# Patient Record
Sex: Female | Born: 1965 | Race: Black or African American | Hispanic: No | Smoking: Current every day smoker
Health system: Southern US, Community
[De-identification: ages and names within clinical notes are randomized; demographics above are authoritative.]

## PROBLEM LIST (undated history)

## (undated) DIAGNOSIS — R569 Unspecified convulsions: Secondary | ICD-10-CM

## (undated) DIAGNOSIS — R519 Headache, unspecified: Secondary | ICD-10-CM

## (undated) DIAGNOSIS — R51 Headache: Secondary | ICD-10-CM

## (undated) DIAGNOSIS — J449 Chronic obstructive pulmonary disease, unspecified: Secondary | ICD-10-CM

## (undated) HISTORY — DX: Unspecified convulsions: R56.9

## (undated) HISTORY — DX: Headache: R51

## (undated) HISTORY — DX: Headache, unspecified: R51.9

## (undated) HISTORY — PX: LEEP: SHX91

## (undated) HISTORY — PX: TUBAL LIGATION: SHX77

## (undated) HISTORY — DX: Chronic obstructive pulmonary disease, unspecified: J44.9

---

## 2016-10-12 ENCOUNTER — Ambulatory Visit: Payer: Self-pay | Admitting: Family Medicine

## 2016-10-12 ENCOUNTER — Ambulatory Visit: Payer: Medicaid Other | Attending: Family Medicine | Admitting: Family Medicine

## 2016-10-12 ENCOUNTER — Encounter: Payer: Self-pay | Admitting: Family Medicine

## 2016-10-12 VITALS — BP 124/81 | HR 63 | Temp 98.1°F | Resp 20 | Ht 67.0 in | Wt 164.5 lb

## 2016-10-12 DIAGNOSIS — Z8709 Personal history of other diseases of the respiratory system: Secondary | ICD-10-CM | POA: Diagnosis not present

## 2016-10-12 DIAGNOSIS — R1011 Right upper quadrant pain: Secondary | ICD-10-CM | POA: Insufficient documentation

## 2016-10-12 DIAGNOSIS — R1031 Right lower quadrant pain: Secondary | ICD-10-CM | POA: Insufficient documentation

## 2016-10-12 DIAGNOSIS — J449 Chronic obstructive pulmonary disease, unspecified: Secondary | ICD-10-CM | POA: Diagnosis not present

## 2016-10-12 DIAGNOSIS — R569 Unspecified convulsions: Secondary | ICD-10-CM | POA: Insufficient documentation

## 2016-10-12 DIAGNOSIS — K5900 Constipation, unspecified: Secondary | ICD-10-CM | POA: Diagnosis not present

## 2016-10-12 DIAGNOSIS — Z79899 Other long term (current) drug therapy: Secondary | ICD-10-CM | POA: Insufficient documentation

## 2016-10-12 DIAGNOSIS — K6289 Other specified diseases of anus and rectum: Secondary | ICD-10-CM | POA: Insufficient documentation

## 2016-10-12 DIAGNOSIS — F17210 Nicotine dependence, cigarettes, uncomplicated: Secondary | ICD-10-CM | POA: Diagnosis not present

## 2016-10-12 DIAGNOSIS — Z87898 Personal history of other specified conditions: Secondary | ICD-10-CM | POA: Diagnosis not present

## 2016-10-12 LAB — CBC WITH DIFFERENTIAL/PLATELET
BASOS PCT: 1 %
Basophils Absolute: 73 cells/uL (ref 0–200)
EOS PCT: 2 %
Eosinophils Absolute: 146 cells/uL (ref 15–500)
HEMATOCRIT: 42 % (ref 35.0–45.0)
HEMOGLOBIN: 13.6 g/dL (ref 11.7–15.5)
LYMPHS ABS: 2701 {cells}/uL (ref 850–3900)
Lymphocytes Relative: 37 %
MCH: 26.3 pg — ABNORMAL LOW (ref 27.0–33.0)
MCHC: 32.4 g/dL (ref 32.0–36.0)
MCV: 81.2 fL (ref 80.0–100.0)
MPV: 9.7 fL (ref 7.5–12.5)
Monocytes Absolute: 438 cells/uL (ref 200–950)
Monocytes Relative: 6 %
NEUTROS PCT: 54 %
Neutro Abs: 3942 cells/uL (ref 1500–7800)
PLATELETS: 308 10*3/uL (ref 140–400)
RBC: 5.17 MIL/uL — AB (ref 3.80–5.10)
RDW: 14.5 % (ref 11.0–15.0)
WBC: 7.3 10*3/uL (ref 3.8–10.8)

## 2016-10-12 LAB — POCT URINALYSIS DIPSTICK
Bilirubin, UA: NEGATIVE
GLUCOSE UA: NEGATIVE
Ketones, UA: NEGATIVE
Leukocytes, UA: NEGATIVE
NITRITE UA: NEGATIVE
PROTEIN UA: NEGATIVE
RBC UA: NEGATIVE
Spec Grav, UA: 1.01
UROBILINOGEN UA: 0.2
pH, UA: 7.5

## 2016-10-12 MED ORDER — SENNOSIDES-DOCUSATE SODIUM 8.6-50 MG PO TABS: 2.0000 | ORAL_TABLET | Freq: Every day | ORAL | Status: DC

## 2016-10-12 MED ORDER — MOMETASONE FURO-FORMOTEROL FUM 100-5 MCG/ACT IN AERO: 2.0000 | INHALATION_SPRAY | Freq: Two times a day (BID) | RESPIRATORY_TRACT | 2 refills | Status: AC

## 2016-10-12 MED ORDER — ALBUTEROL SULFATE HFA 108 (90 BASE) MCG/ACT IN AERS: 2.0000 | INHALATION_SPRAY | Freq: Four times a day (QID) | RESPIRATORY_TRACT | 2 refills | Status: DC | PRN

## 2016-10-12 NOTE — Patient Instructions (Addendum)
You will be called with your lab results. Complete financial paperwork to apply for orange card to complete referral process. Follow up with referral once you receive orange card    Abdominal Pain, Adult Many things can cause belly (abdominal) pain. Most times, belly pain is not dangerous. Many cases of belly pain can be watched and treated at home. Sometimes belly pain is serious, though. Your doctor will try to find the cause of your belly pain. Follow these instructions at home:  Take over-the-counter and prescription medicines only as told by your doctor. Do not take medicines that help you poop (laxatives) unless told to by your doctor.  Drink enough fluid to keep your pee (urine) clear or pale yellow.  Watch your belly pain for any changes.  Keep all follow-up visits as told by your doctor. This is important. Contact a doctor if:  Your belly pain changes or gets worse.  You are not hungry, or you lose weight without trying.  You are having trouble pooping (constipated) or have watery poop (diarrhea) for more than 2-3 days.  You have pain when you pee or poop.  Your belly pain wakes you up at night.  Your pain gets worse with meals, after eating, or with certain foods.  You are throwing up and cannot keep anything down.  You have a fever. Get help right away if:  Your pain does not go away as soon as your doctor says it should.  You cannot stop throwing up.  Your pain is only in areas of your belly, such as the right side or the left lower part of the belly.  You have bloody or black poop, or poop that looks like tar.  You have very bad pain, cramping, or bloating in your belly.  You have signs of not having enough fluid or water in your body (dehydration), such as:  Dark pee, very little pee, or no pee.  Cracked lips.  Dry mouth.  Sunken eyes.  Sleepiness.  Weakness. This information is not intended to replace advice given to you by your health care  provider. Make sure you discuss any questions you have with your health care provider. Document Released: 01/05/2008 Document Revised: 02/06/2016 Document Reviewed: 12/31/2015 Elsevier Interactive Patient Education  2017 [employer] Inc[employer]   Constipation, Adult Constipation is when a person:  Poops (has a bowel movement) fewer times in a week than normal.  Has a hard time pooping.  Has poop that is dry, hard, or bigger than normal. Follow these instructions at home: Eating and drinking    Eat foods that have a lot of fiber, such as:  Fresh fruits and vegetables.  Whole grains.  Beans.  Eat less of foods that are high in fat, low in fiber, or overly processed, such as:  [place] fries.  Hamburgers.  Cookies.  Candy.  Soda.  Drink enough fluid to keep your pee (urine) clear or pale yellow. General instructions   Exercise regularly or as told by your doctor.  Go to the restroom when you feel like you need to poop. Do not hold it in.  Take over-the-counter and prescription medicines only as told by your doctor. These include any fiber supplements.  Do pelvic floor retraining exercises, such as:  Doing deep breathing while relaxing your lower belly (abdomen).  Relaxing your pelvic floor while pooping.  Watch your condition for any changes.  Keep all follow-up visits as told by your doctor. This is important. Contact a doctor if:  You have pain that gets worse.  You have a fever.  You have not pooped for 4 days.  You throw up (vomit).  You are not hungry.  You lose weight.  You are bleeding from the anus.  You have thin, pencil-like poop (stool). Get help right away if:  You have a fever, and your symptoms suddenly get worse.  You leak poop or have blood in your poop.  Your belly feels hard or bigger than normal (is bloated).  You have very bad belly pain.  You feel dizzy or you faint. This information is not intended to replace advice given to  you by your health care provider. Make sure you discuss any questions you have with your health care provider. Document Released: 01/05/2008 Document Revised: 02/06/2016 Document Reviewed: 01/07/2016 Elsevier Interactive Patient Education  2017 Elsevier Inc.  Abdominal or Pelvic Ultrasound An ultrasound is a test that takes pictures of the inside of the body. An abdominal ultrasound takes pictures of your belly. A pelvic ultrasound takes pictures of the area between your belly and thighs. An ultrasound may be done to check an organ or look for problems. If you are pregnant, it may be done to learn about your baby. What happens before the procedure?  Follow instructions from your doctor about what you cannot eat or drink.  Wear clothing that is easy to wash. Gel from the test might get on your clothes. What happens during the procedure?  A gel will be put on your skin. It may feel cool.  A wand called a transducer will be put on your skin.  The wand will take pictures. They will show on small TV screens. What happens after the procedure?  Get your test results. Ask your doctor or the department that did the test when your results will be ready.  Keep follow-up visits as told by your doctor. This is important. This information is not intended to replace advice given to you by your health care provider. Make sure you discuss any questions you have with your health care provider. Document Released: 08/21/2010 Document Revised: 03/18/2016 Document Reviewed: 04/11/2015 Elsevier Interactive Patient Education  2017 [employer]

## 2016-10-12 NOTE — Progress Notes (Signed)
Subjective:  Patient ID: Mary Mclaughlin, female    DOB: 01/17/1966  Age: 51 y.o. MRN: [id number]  CC: No chief complaint on file.   HPI Mary Mclaughlin presents for  To Establish care: Reports moving from a different state several months ago. She doesn't bring her records with her.  COPD:  Reports being last seen by her pulmonologist in June 2017. Current everyday smoker for last 28 years. Reports smoking less than 10 cigarettes per day. Reports being without her Dulera for several months.   Rectal pain: Stabbing, intermittment  rectal pain 2 weeks ago, RLQ/RUQ pain at the umbilicus. Nausea and vomiting. Vomited on Saturday 4 times with clear thick emesis. History of hemorrhoids. Difficulty passing bowel movements.  Once a week bowel movements for 2 weeks now. Dark green stool. Unsure of family history of colon cancer. Reports history of appendicitis. Reports having done in colonoscopy 2015 that was normal.  Denies taking anything for symptoms.   Psychiatrist - Reports history of seeing a psychiatrist out of state for depressoin. Reports currently seeing psychiatrist at [facility] with upcoming appointment on April 3 rd . Denies any SI/HI.  Seizure: Last seizure Feb. 17. Reports taking Topamax since 2012. Reports seizures  well controlled with Topamax.    No outpatient prescriptions prior to visit.   No facility-administered medications prior to visit.     ROS Review of Systems  Constitutional: Negative.   Respiratory: Negative.   Cardiovascular: Negative.   Gastrointestinal: Positive for abdominal pain, constipation, nausea, rectal pain and vomiting. Negative for blood in stool.  Neurological: Negative.   Psychiatric/Behavioral: Negative for suicidal ideas.    Objective:  BP 124/81 (BP Location: Left Arm, Patient Position: Sitting, Cuff Size: Small)   Pulse 63   Temp 98.1 F (36.7 C) (Oral)   Resp 20   Ht 5\' 7"  (1.702 m)   Wt 164 lb 8 oz (74.6 kg)   LMP 10/02/2016 (Exact Date)    SpO2 100%   BMI 25.76 kg/m   BP/Weight 10/12/2016  Systolic BP 124  Diastolic BP 81  Wt. (Lbs) 164.5  BMI 25.76    Physical Exam  Constitutional: She is oriented to person, place, and time.  HENT:  Head: Normocephalic.  Right Ear: External ear normal.  Left Ear: External ear normal.  Nose: Nose normal.  Mouth/Throat: Oropharynx is clear and moist.  Eyes: Conjunctivae are normal. Pupils are equal, round, and reactive to light.  Neck: No JVD present.  Cardiovascular: Normal rate, regular rhythm, normal heart sounds and intact distal pulses.   Pulmonary/Chest: Effort normal and breath sounds normal.  Abdominal: Soft. Bowel sounds are normal. There is tenderness (RUQ/RLQ).  Genitourinary: Rectal exam shows no mass and guaiac negative stool.  Neurological: She is alert and oriented to person, place, and time.  Skin: Skin is warm and dry.  Psychiatric: She expresses no homicidal and no suicidal ideation. She expresses no suicidal plans and no homicidal plans.  Nursing note and vitals reviewed.   Assessment & Plan:   Problem List Items Addressed This Visit    None    Visit Diagnoses    RLQ abdominal pain    -  Primary   Relevant Orders   COMPLETE METABOLIC PANEL WITH GFR (Completed)   Lipid Panel (Completed)   CBC with Differential (Completed)   US Abdomen Complete   Urinalysis Dipstick (Completed)   Ambulatory referral to Gastroenterology   POC Hemoccult Bld/Stl (1-Cd Office Dx)   RUQ abdominal pain  Relevant Orders   COMPLETE METABOLIC PANEL WITH GFR (Completed)   Lipid Panel (Completed)   CBC with Differential (Completed)   US Abdomen Complete   Ambulatory referral to Gastroenterology   History of COPD       Relevant Medications   mometasone-formoterol (DULERA) 100-5 MCG/ACT AERO   albuterol (PROVENTIL HFA;VENTOLIN HFA) 108 (90 Base) MCG/ACT inhaler   Other Relevant Orders   Ambulatory referral to Pulmonology   History of seizure       Rectal pain        -Fecal hemoccult negative in office.    Relevant Medications   senna-docusate (SENNA-S) 8.6-50 MG tablet   Other Relevant Orders   Ambulatory referral to Gastroenterology   POC Hemoccult Bld/Stl (1-Cd Office Dx)   Difficult bowel movements       Relevant Medications   senna-docusate (SENNA-S) 8.6-50 MG tablet   Other Relevant Orders   Ambulatory referral to Gastroenterology   POC Hemoccult Bld/Stl (1-Cd Office Dx)   Constipation, unspecified constipation type       Relevant Medications   senna-docusate (SENNA-S) 8.6-50 MG tablet   Other Relevant Orders   Ambulatory referral to Gastroenterology   POC Hemoccult Bld/Stl (1-Cd Office Dx)      Meds ordered this encounter  Medications  . senna-docusate (SENNA-S) 8.6-50 MG tablet    Sig: Take 2 tablets by mouth at bedtime.    Order Specific Question:   Supervising Provider    Answer:   Quentin Angst [id number]  . mometasone-formoterol (DULERA) 100-5 MCG/ACT AERO    Sig: Inhale 2 puffs into the lungs 2 (two) times daily.    Dispense:  13 g    Refill:  2    Order Specific Question:   Supervising Provider    Answer:   Quentin Angst [id number]  . albuterol (PROVENTIL HFA;VENTOLIN HFA) 108 (90 Base) MCG/ACT inhaler    Sig: Inhale 2 puffs into the lungs every 6 (six) hours as needed for wheezing or shortness of breath.    Dispense:  1 Inhaler    Refill:  2    Order Specific Question:   Supervising Provider    Answer:   Quentin Angst [id number]    Follow-up: Return if symptoms worsen or fail to improve. Return in about 1 month (around 11/12/2016),  for COPD.   Lizbeth Bark FNP

## 2016-10-13 ENCOUNTER — Telehealth: Payer: Self-pay

## 2016-10-13 LAB — COMPLETE METABOLIC PANEL WITH GFR
ALBUMIN: 4.6 g/dL (ref 3.6–5.1)
ALT: 12 U/L (ref 6–29)
AST: 21 U/L (ref 10–35)
Alkaline Phosphatase: 68 U/L (ref 33–115)
BILIRUBIN TOTAL: 0.8 mg/dL (ref 0.2–1.2)
BUN: 11 mg/dL (ref 7–25)
CO2: 22 mmol/L (ref 20–31)
CREATININE: 0.95 mg/dL (ref 0.50–1.10)
Calcium: 10.1 mg/dL (ref 8.6–10.2)
Chloride: 102 mmol/L (ref 98–110)
GFR, Est African American: 82 mL/min (ref >=60)
GFR, Est Non African American: 71 mL/min (ref >=60)
GLUCOSE: 84 mg/dL (ref 65–99)
Potassium: 4.2 mmol/L (ref 3.5–5.3)
SODIUM: 139 mmol/L (ref 135–146)
TOTAL PROTEIN: 7.8 g/dL (ref 6.1–8.1)

## 2016-10-13 LAB — LIPID PANEL
Cholesterol: 184 mg/dL (ref <200)
HDL: 68 mg/dL (ref >50)
LDL CALC: 102 mg/dL — AB (ref <100)
Total CHOL/HDL Ratio: 2.7 Ratio (ref <5.0)
Triglycerides: 68 mg/dL (ref <150)
VLDL: 14 mg/dL (ref <30)

## 2016-10-13 NOTE — Telephone Encounter (Signed)
CMA call to go over lab results  Patient Verify DOB  Patient was aware and understood   

## 2016-10-13 NOTE — Telephone Encounter (Signed)
-----   Message from Lizbeth BarkMandesia R Hairston, [place] sent at 10/13/2016 12:44 PM EDT ----- Kidney function normal Liver function normal Cholesterol levels are normal Encourage patient to quit smoking.

## 2016-10-19 ENCOUNTER — Ambulatory Visit (HOSPITAL_COMMUNITY): Payer: Medicaid Other

## 2016-10-21 ENCOUNTER — Ambulatory Visit (HOSPITAL_COMMUNITY)
Admission: RE | Admit: 2016-10-21 | Discharge: 2016-10-21 | Disposition: A | Discharge status: Home / Self Care | Payer: Medicaid Other | Source: Ambulatory Visit | Attending: Family Medicine | Admitting: Family Medicine

## 2016-10-21 DIAGNOSIS — R1031 Right lower quadrant pain: Secondary | ICD-10-CM | POA: Insufficient documentation

## 2016-10-21 DIAGNOSIS — R1011 Right upper quadrant pain: Secondary | ICD-10-CM | POA: Diagnosis present

## 2016-10-22 ENCOUNTER — Telehealth: Payer: Self-pay

## 2016-10-22 NOTE — Telephone Encounter (Signed)
CMA call to go over lab results  Patient Verify DOB  Patient was aware and understood   

## 2016-10-22 NOTE — Telephone Encounter (Signed)
-----   Message from Lizbeth BarkMandesia R Hairston, FNP sent at 10/21/2016  5:55 PM EDT ----- Abdominal ultrasound was normal. No mass or structural abnormality of the organs were present. Follow up with gastroenterologist referral.

## 2016-11-17 ENCOUNTER — Ambulatory Visit: Payer: Medicaid Other | Admitting: Family Medicine

## 2016-11-24 ENCOUNTER — Encounter: Payer: Self-pay | Admitting: Family Medicine

## 2016-11-24 ENCOUNTER — Ambulatory Visit: Payer: Medicaid Other | Attending: Family Medicine | Admitting: Family Medicine

## 2016-11-24 VITALS — BP 114/76 | HR 68 | Temp 98.1°F | Resp 18 | Ht 67.0 in | Wt 164.0 lb

## 2016-11-24 DIAGNOSIS — K5900 Constipation, unspecified: Secondary | ICD-10-CM | POA: Diagnosis not present

## 2016-11-24 DIAGNOSIS — Z79899 Other long term (current) drug therapy: Secondary | ICD-10-CM | POA: Diagnosis not present

## 2016-11-24 DIAGNOSIS — F17210 Nicotine dependence, cigarettes, uncomplicated: Secondary | ICD-10-CM | POA: Diagnosis not present

## 2016-11-24 DIAGNOSIS — F17200 Nicotine dependence, unspecified, uncomplicated: Secondary | ICD-10-CM | POA: Diagnosis not present

## 2016-11-24 DIAGNOSIS — G40909 Epilepsy, unspecified, not intractable, without status epilepticus: Secondary | ICD-10-CM | POA: Diagnosis not present

## 2016-11-24 DIAGNOSIS — F329 Major depressive disorder, single episode, unspecified: Secondary | ICD-10-CM | POA: Insufficient documentation

## 2016-11-24 DIAGNOSIS — Z973 Presence of spectacles and contact lenses: Secondary | ICD-10-CM

## 2016-11-24 DIAGNOSIS — J449 Chronic obstructive pulmonary disease, unspecified: Secondary | ICD-10-CM | POA: Diagnosis not present

## 2016-11-24 DIAGNOSIS — Z8669 Personal history of other diseases of the nervous system and sense organs: Secondary | ICD-10-CM

## 2016-11-24 DIAGNOSIS — Z7951 Long term (current) use of inhaled steroids: Secondary | ICD-10-CM | POA: Insufficient documentation

## 2016-11-24 MED ORDER — PSYLLIUM 28 % PO PACK: 1.0000 | PACK | Freq: Two times a day (BID) | ORAL | Status: DC

## 2016-11-24 MED ORDER — NICOTINE 21 MG/24HR TD PT24: 21.0000 mg | MEDICATED_PATCH | Freq: Every day | TRANSDERMAL | 1 refills | Status: DC

## 2016-11-24 MED ORDER — BUTALBITAL-APAP-CAFFEINE 50-325-40 MG PO TABS: 1.0000 | ORAL_TABLET | Freq: Four times a day (QID) | ORAL | 0 refills | Status: DC | PRN

## 2016-11-24 MED ORDER — TOPIRAMATE 100 MG PO TABS: 100.0000 mg | ORAL_TABLET | Freq: Two times a day (BID) | ORAL | 0 refills | Status: DC

## 2016-11-24 NOTE — Patient Instructions (Signed)
Nicotine skin patches What is this medicine? NICOTINE (NIK [place]) helps people stop smoking. The patches replace the nicotine found in cigarettes and help to decrease withdrawal effects. They are most effective when used in combination with a stop-smoking program. This medicine may be used for other purposes; ask your health care provider or pharmacist if you have questions. COMMON BRAND NAME(S): Habitrol, Nicoderm CQ, Nicotrol What should I tell my health care provider before I take this medicine? They need to know if you have any of these conditions: -diabetes -heart disease, angina, irregular heartbeat or previous heart attack -high blood pressure -lung disease, including asthma -overactive thyroid -pheochromocytoma -seizures or a history of seizures -skin problems, like eczema -stomach problems or ulcers -an unusual or allergic reaction to nicotine, adhesives, other medicines, foods, dyes, or preservatives -pregnant or trying to get pregnant -breast-feeding How should I use this medicine? This medicine is for use on the skin. Follow the directions that come with the patches. Find an area of skin on your upper arm, chest, or back that is clean, dry, greaseless, undamaged and hairless. Wash hands with plain soap and water. Do not use anything that contains aloe, lanolin or glycerin as these may prevent the patch from sticking. Dry thoroughly. Remove the patch from the sealed pouch. Do not try to cut or trim the patch. Using your palm, press the patch firmly in place for 10 seconds to make sure that there is good contact with your skin. After applying the patch, wash your hands. Change the patch every day, keeping to a regular schedule. When you apply a new patch, use a new area of skin. Wait at least 1 week before using the same area again. Talk to your pediatrician regarding the use of this medicine in children. Special care may be needed. Overdosage: If you think you have taken too much  of this medicine contact a poison control center or emergency room at once. NOTE: This medicine is only for you. Do not share this medicine with others. What if I miss a dose? If you forget to replace a patch, use it as soon as you can. Only use one patch at a time and do not leave on the skin for longer than directed. If a patch falls off, you can replace it, but keep to your schedule and remove the patch at the right time. What may interact with this medicine? -medicines for asthma -medicines for blood pressure -medicines for mental depression This list may not describe all possible interactions. Give your health care provider a list of all the medicines, herbs, non-prescription drugs, or dietary supplements you use. Also tell them if you smoke, drink alcohol, or use illegal drugs. Some items may interact with your medicine. What should I watch for while using this medicine? You should begin using the nicotine patch the day you stop smoking. It is okay if you do not succeed at your attempt to quit and have a cigarette. You can still continue your quit attempt and keep using the product as directed. Just throw away your cigarettes and get back to your quit plan. You can keep the patch in place during swimming, bathing, and showering. If your patch falls off during these activities, replace it. When you first apply the patch, your skin may itch or burn. This should go away soon. When you remove a patch, the skin may look red, but this should only last for a few days. Call your doctor or health care professional   if skin redness does not go away after 4 days, if your skin swells, or if you get a rash. If you are a diabetic and you quit smoking, the effects of insulin may be increased and you may need to reduce your insulin dose. Check with your doctor or health care professional about how you should adjust your insulin dose. If you are going to have a magnetic resonance imaging (MRI) procedure, tell your  MRI technician if you have this patch on your body. It must be removed before a MRI. What side effects may I notice from receiving this medicine? Side effects that you should report to your doctor or health care professional as soon as possible: -allergic reactions like skin rash, itching or hives, swelling of the face, lips, or tongue -breathing problems -changes in hearing -changes in vision -chest pain -cold sweats -confusion -fast, irregular heartbeat -feeling faint or lightheaded, falls -headache -increased saliva -skin redness that lasts more than 4 days -stomach pain -signs and symptoms of nicotine overdose like nausea; vomiting; dizziness; weakness; and rapid heartbeat Side effects that usually do not require medical attention (report to your doctor or health care professional if they continue or are bothersome): -diarrhea -dry mouth -hiccups -irritability -nervousness or restlessness -trouble sleeping or vivid dreams This list may not describe all possible side effects. Call your doctor for medical advice about side effects. You may report side effects to FDA at 1-800-FDA-1088. Where should I keep my medicine? Keep out of the reach of children. Store at room temperature between 20 and 25 degrees C (68 and 77 degrees F). Protect from heat and light. Store in Tax inspector until ready to use. Throw away unused medicine after the expiration date. When you remove a patch, fold with sticky sides together; put in an empty opened pouch and throw away. NOTE: This sheet is a summary. It may not cover all possible information. If you have questions about this medicine, talk to your doctor, pharmacist, or health care provider.  2018 Elsevier/Gold Standard (2014-06-17 15:46:21) Acetaminophen; Butalbital; Caffeine tablets or capsules What is this medicine? ACETAMINOPHEN; BUTALBITAL; CAFFEINE (a set a MEE noe fen; byoo TAL bi tal; KAF een) is a pain reliever. It is used to treat  tension headaches. This medicine may be used for other purposes; ask your health care provider or pharmacist if you have questions. COMMON BRAND NAME(S): Alagesic, Americet, Anolor-300, Arcet, BAC, CAPACET, Dolgic Plus, Esgic, Esgic Plus, Ezol, Fioricet, Ryder System, Medigesic, [place], [address] [place], Phrenilin Forte, Repan, [place], Triad, Zebutal What should I tell my health care provider before I take this medicine? They need to know if you have any of these conditions: -drug abuse or addiction -heart or circulation problems -if you often drink alcohol -kidney disease or problems going to the bathroom -liver disease -lung disease, asthma, or breathing problems -porphyria -an unusual or allergic reaction to acetaminophen, butalbital or other barbiturates, caffeine, other medicines, foods, dyes, or preservatives -pregnant or trying to get pregnant -breast-feeding How should I use this medicine? Take this medicine by mouth with a full glass of water. Follow the directions on the prescription label. If the medicine upsets your stomach, take the medicine with food or milk. Do not take more than you are told to take. Talk to your pediatrician regarding the use of this medicine in children. Special care may be needed. Overdosage: If you think you have taken too much of this medicine contact a poison control center or emergency room at once. NOTE: This medicine  is only for you. Do not share this medicine with others. What if I miss a dose? If you miss a dose, take it as soon as you can. If it is almost time for your next dose, take only that dose. Do not take double or extra doses. What may interact with this medicine? -alcohol or medicines that contain alcohol -antidepressants, especially MAOIs like isocarboxazid, phenelzine, tranylcypromine, and selegiline -antihistamines -benzodiazepines -carbamazepine -isoniazid -medicines for pain like pentazocine, buprenorphine, butorphanol, nalbuphine,  tramadol, and propoxyphene -muscle relaxants -naltrexone -phenobarbital, phenytoin, and fosphenytoin -phenothiazines like perphenazine, thioridazine, chlorpromazine, mesoridazine, fluphenazine, prochlorperazine, promazine, and trifluoperazine -voriconazole This list may not describe all possible interactions. Give your health care provider a list of all the medicines, herbs, non-prescription drugs, or dietary supplements you use. Also tell them if you smoke, drink alcohol, or use illegal drugs. Some items may interact with your medicine. What should I watch for while using this medicine? Tell your doctor or health care professional if your pain does not go away, if it gets worse, or if you have new or a different type of pain. You may develop tolerance to the medicine. Tolerance means that you will need a higher dose of the medicine for pain relief. Tolerance is normal and is expected if you take the medicine for a long time. Do not suddenly stop taking your medicine because you may develop a severe reaction. Your body becomes used to the medicine. This does NOT mean you are addicted. Addiction is a behavior related to getting and using a drug for a non-medical reason. If you have pain, you have a medical reason to take pain medicine. Your doctor will tell you how much medicine to take. If your doctor wants you to stop the medicine, the dose will be slowly lowered over time to avoid any side effects. You may get drowsy or dizzy when you first start taking the medicine or change doses. Do not drive, use machinery, or do anything that may be dangerous until you know how the medicine affects you. Stand or sit up slowly. Do not take other medicines that contain acetaminophen with this medicine. Always read labels carefully. If you have questions, ask your doctor or pharmacist. If you take too much acetaminophen get medical help right away. Too much acetaminophen can be very dangerous and cause liver damage.  Even if you do not have symptoms, it is important to get help right away. What side effects may I notice from receiving this medicine? Side effects that you should report to your doctor or health care professional as soon as possible: -allergic reactions like skin rash, itching or hives, swelling of the face, lips, or tongue -breathing problems -confusion -feeling faint or lightheaded, falls -redness, blistering, peeling or loosening of the skin, including inside the mouth -seizure -stomach pain -yellowing of the eyes or skin Side effects that usually do not require medical attention (report to your doctor or health care professional if they continue or are bothersome): -constipation -nausea, vomiting This list may not describe all possible side effects. Call your doctor for medical advice about side effects. You may report side effects to FDA at 1-800-FDA-1088. Where should I keep my medicine? Keep out of the reach of children. This medicine can be abused. Keep your medicine in a safe place to protect it from theft. Do not share this medicine with anyone. Selling or giving away this medicine is dangerous and against the law. This medicine may cause accidental overdose and death if it  taken by other adults, children, or pets. Mix any unused medicine with a substance like cat litter or coffee grounds. Then throw the medicine away in a sealed container like a sealed bag or a coffee can with a lid. Do not use the medicine after the expiration date. Store at room temperature between 15 and 30 degrees C (59 and 86 degrees F). NOTE: This sheet is a summary. It may not cover all possible information. If you have questions about this medicine, talk to your doctor, pharmacist, or health care provider.  2018 Elsevier/Gold Standard (2013-09-14 15:00:25)  High-Fiber Diet Fiber, also called dietary fiber, is a type of carbohydrate found in fruits, vegetables, whole grains, and beans. A high-fiber diet can  have many health benefits. Your health care provider may recommend a high-fiber diet to help:  Prevent constipation. Fiber can make your bowel movements more regular.  Lower your cholesterol.  Relieve hemorrhoids, uncomplicated diverticulosis, or irritable bowel syndrome.  Prevent overeating as part of a weight-loss plan.  Prevent heart disease, type 2 diabetes, and certain cancers. What is my plan? The recommended daily intake of fiber includes:  38 grams for men under age 84.  30 grams for men over age 10.  25 grams for women under age 80.  21 grams for women over age 37. You can get the recommended daily intake of dietary fiber by eating a variety of fruits, vegetables, grains, and beans. Your health care provider may also recommend a fiber supplement if it is not possible to get enough fiber through your diet. What do I need to know about a high-fiber diet?  Fiber supplements have not been widely studied for their effectiveness, so it is better to get fiber through food sources.  Always check the fiber content on thenutrition facts label of any prepackaged food. Look for foods that contain at least 5 grams of fiber per serving.  Ask your dietitian if you have questions about specific foods that are related to your condition, especially if those foods are not listed in the following section.  Increase your daily fiber consumption gradually. Increasing your intake of dietary fiber too quickly may cause bloating, cramping, or gas.  Drink plenty of water. Water helps you to digest fiber. What foods can I eat? Grains  Whole-grain breads. Multigrain cereal. Oats and oatmeal. Brown rice. Barley. Bulgur wheat. Millet. Bran muffins. Popcorn. Rye wafer crackers. Vegetables  Sweet potatoes. Spinach. Kale. Artichokes. Cabbage. Broccoli. Green peas. Carrots. Squash. Fruits  Berries. Pears. Apples. Oranges. Avocados. Prunes and raisins. Dried figs. Meats and Other Protein Sources    [place], kidney, pinto, and soy beans. Split peas. Lentils. Nuts and seeds. Dairy  Fiber-fortified yogurt. Beverages  Fiber-fortified soy milk. Fiber-fortified orange juice. Other  Fiber bars. The items listed above may not be a complete list of recommended foods or beverages. Contact your dietitian for more options.  What foods are not recommended? Grains  White bread. Pasta made with refined flour. White rice. Vegetables  Fried potatoes. Canned vegetables. Well-cooked vegetables. Fruits  Fruit juice. Cooked, strained fruit. Meats and Other Protein Sources  Fatty cuts of meat. Fried Environmental education officer or fried fish. Dairy  Milk. Yogurt. Cream cheese. Sour cream. Beverages  Soft drinks. Other  Cakes and pastries. Butter and oils. The items listed above may not be a complete list of foods and beverages to avoid. Contact your dietitian for more information.  What are some tips for including high-fiber foods in my diet?  Eat a wide variety  of high-fiber foods.  Make sure that half of all grains consumed each day are whole grains.  Replace breads and cereals made from refined flour or white flour with whole-grain breads and cereals.  Replace white rice with brown rice, bulgur wheat, or millet.  Start the day with a breakfast that is high in fiber, such as a cereal that contains at least 5 grams of fiber per serving.  Use beans in place of meat in soups, salads, or pasta.  Eat high-fiber snacks, such as berries, raw vegetables, nuts, or popcorn. This information is not intended to replace advice given to you by your health care provider. Make sure you discuss any questions you have with your health care provider. Document Released: 07/19/2005 Document Revised: 12/25/2015 Document Reviewed: 01/01/2014 Elsevier Interactive Patient Education  2017 [employer].

## 2016-11-24 NOTE — Progress Notes (Signed)
Patient is here for f/up COPD  Patient complains dizziness as long with her migraines   Headaches are usually on her front & left side  Patient is not taking any current medication  Patient has eaten for today

## 2016-11-24 NOTE — Progress Notes (Signed)
Subjective:  Patient ID: Mary Mclaughlin, female    DOB: 03-22-1966  Age: 51 y.o. MRN: [id number]  CC: Establish Care   HPI Mary Mclaughlin presents for   COPD:  Reports that appointment May 17 with pulmonologist. Current smoker half a pack a day for over 20 years. Denies any shortness of breath or chronic cough or hemoptysis. Reports adherence with Dulera. Denies any increased inhaler use. She reports she is ready to quit and requests nicotine gum.   Seizure: History of seizure disorder. Reports last seizure March 2018. Reports adherence with medication for seizures. Denies any vision changes or difficulty keeping her balance. Wears glasses reports it has been more than 1 year since opthalmology visit.  Prior to relocation she reports being followed by neurologist. She denies being followed by neurologist currently.  Constipation: Reports following up with GI. [facility] with biospy of stomach.  Reports difficulty passing bowel movements. Denies any constitutional symptoms, pencil thin stools, hematochezia, and melena.   History of depression: Reports currently seeing psychiatrist for management of depression symptoms. Denies any SI/HI.   Outpatient Medications Prior to Visit  Medication Sig Dispense Refill  . albuterol (PROVENTIL HFA;VENTOLIN HFA) 108 (90 Base) MCG/ACT inhaler Inhale 2 puffs into the lungs every 6 (six) hours as needed for wheezing or shortness of breath. 1 Inhaler 2  . citalopram (CELEXA) 20 MG tablet Take 20 mg by mouth daily.    . mometasone-formoterol (DULERA) 100-5 MCG/ACT AERO Inhale 2 puffs into the lungs 2 (two) times daily.    . mometasone-formoterol (DULERA) 100-5 MCG/ACT AERO Inhale 2 puffs into the lungs 2 (two) times daily. 13 g 2  . QUEtiapine (SEROQUEL) 100 MG tablet Take 100 mg by mouth 2 (two) times daily.    Marland Kitchen senna-docusate (SENNA-S) 8.6-50 MG tablet Take 2 tablets by mouth at bedtime.    . topiramate (TOPAMAX) 100 MG tablet Take 100 mg by mouth 2 (two) times  daily.     No facility-administered medications prior to visit.     ROS Review of Systems  Constitutional: Negative.   Eyes: Negative.   Respiratory: Negative.   Cardiovascular: Negative.   Gastrointestinal: Positive for constipation.  Skin: Negative.   Neurological: Negative.     Objective:  BP 114/76 (BP Location: Left Arm, Patient Position: Sitting, Cuff Size: Normal)   Pulse 68   Temp 98.1 F (36.7 C) (Oral)   Resp 18   Ht  (1.702 m)   Wt 164 lb (74.4 kg)   SpO2 99%   BMI 25.69 kg/m   BP/Weight 11/24/2016 10/12/2016  Systolic BP 114 124  Diastolic BP 76 81  Wt. (Lbs) 164 164.5  BMI 25.69 25.76     Physical Exam  HENT:  Head: Normocephalic.  Right Ear: External ear normal.  Left Ear: External ear normal.  Nose: Nose normal.  Mouth/Throat: Oropharynx is clear and moist.  Eyes: Conjunctivae and EOM are normal. Pupils are equal, round, and reactive to light.  Neck: Normal range of motion. No JVD present.  Cardiovascular: Normal rate, regular rhythm, normal heart sounds and intact distal pulses.   Pulmonary/Chest: Effort normal and breath sounds normal.  Abdominal: Soft. Bowel sounds are normal. There is no tenderness.  Skin: Skin is warm and dry.  Psychiatric: She has a normal mood and affect. She expresses no homicidal and no suicidal ideation. She expresses no suicidal plans and no homicidal plans.  Nursing note and vitals reviewed.   Assessment & Plan:   Problem  List Items Addressed This Visit    None    Visit Diagnoses    Chronic obstructive pulmonary disease, unspecified COPD type (HCC)    -  Primary   Relevant Medications   nicotine (NICODERM CQ) 21 mg/24hr patch   History of seizure disorder       Relevant Medications   butalbital-acetaminophen-caffeine (FIORICET, ESGIC) 50-325-40 MG tablet   topiramate (TOPAMAX) 100 MG tablet   Other Relevant Orders   Ambulatory referral to Neurology   Ready to quit smoking       Relevant Medications    nicotine (NICODERM CQ) 21 mg/24hr patch   Constipation, unspecified constipation type       Relevant Medications   psyllium (METAMUCIL SMOOTH TEXTURE) 28 % packet   Wears glasses       Relevant Orders   Ambulatory referral to Ophthalmology      Meds ordered this encounter  Medications  . nicotine (NICODERM CQ) 21 mg/24hr patch    Sig: Place 1 patch (21 mg total) onto the skin daily.    Dispense:  28 patch    Refill:  1    Order Specific Question:   Supervising Provider    Answer:   Quentin Angst [id number]  . psyllium (METAMUCIL SMOOTH TEXTURE) 28 % packet    Sig: Take 1 packet by mouth 2 (two) times daily.    Order Specific Question:   Supervising Provider    Answer:   Quentin Angst [id number]  . butalbital-acetaminophen-caffeine (FIORICET, ESGIC) 50-325-40 MG tablet    Sig: Take 1 tablet by mouth every 6 (six) hours as needed for migraine.    Dispense:  30 tablet    Refill:  0    Order Specific Question:   Supervising Provider    Answer:   Quentin Angst [id number]  . topiramate (TOPAMAX) 100 MG tablet    Sig: Take 1 tablet (100 mg total) by mouth 2 (two) times daily.    Dispense:  180 tablet    Refill:  0    Order Specific Question:   Supervising Provider    Answer:   Quentin Angst [[id number]]      Lizbeth Bark FNP

## 2016-11-26 ENCOUNTER — Other Ambulatory Visit: Payer: Self-pay | Admitting: Gastroenterology

## 2016-11-26 DIAGNOSIS — R10814 Left lower quadrant abdominal tenderness: Secondary | ICD-10-CM

## 2016-12-01 ENCOUNTER — Institutional Professional Consult (permissible substitution): Payer: Medicaid Other | Admitting: Internal Medicine

## 2016-12-02 ENCOUNTER — Institutional Professional Consult (permissible substitution): Payer: Medicaid Other | Admitting: Internal Medicine

## 2016-12-06 ENCOUNTER — Other Ambulatory Visit: Payer: Medicaid Other

## 2016-12-08 ENCOUNTER — Other Ambulatory Visit: Payer: Medicaid Other

## 2016-12-09 ENCOUNTER — Encounter: Payer: Self-pay | Admitting: Neurology

## 2016-12-09 ENCOUNTER — Ambulatory Visit (INDEPENDENT_AMBULATORY_CARE_PROVIDER_SITE_OTHER): Payer: Medicaid Other | Admitting: Neurology

## 2016-12-09 VITALS — BP 102/69 | HR 76 | Resp 18 | Ht 67.0 in | Wt 162.0 lb

## 2016-12-09 DIAGNOSIS — Z79899 Other long term (current) drug therapy: Secondary | ICD-10-CM | POA: Diagnosis not present

## 2016-12-09 DIAGNOSIS — G40209 Localization-related (focal) (partial) symptomatic epilepsy and epileptic syndromes with complex partial seizures, not intractable, without status epilepticus: Secondary | ICD-10-CM

## 2016-12-09 DIAGNOSIS — G43009 Migraine without aura, not intractable, without status migrainosus: Secondary | ICD-10-CM | POA: Diagnosis not present

## 2016-12-09 MED ORDER — SUMATRIPTAN SUCCINATE 100 MG PO TABS: 100.0000 mg | ORAL_TABLET | Freq: Once | ORAL | 12 refills | Status: DC | PRN

## 2016-12-09 MED ORDER — TOPIRAMATE ER 200 MG PO SPRINKLE CAP24: 200.0000 mg | EXTENDED_RELEASE_CAPSULE | Freq: Every day | ORAL | 11 refills | Status: DC

## 2016-12-09 NOTE — Progress Notes (Signed)
[facility]    Provider:  Dr Lucia Gaskins Referring Provider: Thomas Hoff* Primary Care Physician:  Lizbeth Bark, FNP  CC:  Seizures  HPI:  Mary Mclaughlin is a 51 y.o. female here as a referral from Dr. Jenelle Mages for seizures and migraines. Past medical history of COPD following with pulmonology and is a current smoker half a pack a day for over 20 years, depression. She has a history of a seizure disorder. She is on Topiramate and not taking medication regularly. She was diagnosed with seizures in 2012, her body went limp and she had shaking. She had an eeg, she used to see a neurologist every month at [facility] in [place], she had EEGs and MRI of the brain at the time she had a history of alcohol and drug abuse. She has not seen a neurologist since 2016 but did regularly see a neurologist from 2012-2016. Her migraines are on the front of the head and the left side. She gets migraines 3-4 times a week. She gets dizzy when she bends over and stands up. Sleeping helps, she has light sesnitivity and can't move, darkness helps. Unknown triggers, she is always tired. She goes to bed at 9pm and sleeps well. The last time she had a seizure was in March. Before that in February. The migraines and seizures are well controlled if she takes the medication but she forgets to take it twice a day. She has daily headaches, over 15 are migrainous, no aura, she has nausea and vomiting and light and sound sensitivity. They can last all day unless she sleeps or tries to take something. No medication overuse. No other focal neurologic deficits, associated symptoms, inciting events or modifiable factors.  Meds tried: Topiramate, Celexa   Reviewed notes, labs and imaging from outside physicians, which showed:  CBC and CMP unremarkable.   Review primary care notes. She has a history of seizure disorder. Last seizure was March 2018. Reports adherence with medication for seizures.  Denies any vision changes or difficulty keeping her balance. She recently moved and she was followed by a neurologist previously. Reports starting Topiramate in 2012. She sees psychiatry for depression.She is a current smoker. Reported Seizure Feb 17th and Mar of this year.   Review of Systems: Patient complains of symptoms per HPI as well as the following symptoms: No chest pain, no history of cardiac problems or strokes. Pertinent negatives per HPI. All others negative.   Social History   Social History  . Marital status: Single    Spouse name: N/A  . Number of children: 3  . Years of education: N/A   Occupational History  . N/A    Social History Main Topics  . Smoking status: Current Every Day Smoker    Types: Cigarettes  . Smokeless tobacco: Never Used  . Alcohol use 1.2 oz/week    2 Cans of beer per week     Comment: rarely  . Drug use: No  . Sexual activity: Yes   Other Topics Concern  . Not on file   Social History Narrative   Drinks 1 cup of coffee a day, occasional [employer]     Family History  Problem Relation Age of Onset  . Hypertension Mother   . Cancer Father   . Seizures Neg Hx     Past Medical History:  Diagnosis Date  . COPD (chronic obstructive pulmonary disease) (HCC)   . Headache   . Seizures (HCC)     Past  Surgical History:  Procedure Laterality Date  . LEEP      Current Outpatient Prescriptions  Medication Sig Dispense Refill  . mometasone-formoterol (DULERA) 100-5 MCG/ACT AERO Inhale 2 puffs into the lungs 2 (two) times daily.    . mometasone-formoterol (DULERA) 100-5 MCG/ACT AERO Inhale 2 puffs into the lungs 2 (two) times daily. 13 g 2  . nicotine (NICODERM CQ) 21 mg/24hr patch Place 1 patch (21 mg total) onto the skin daily. 28 patch 1  . pantoprazole (PROTONIX) 20 MG tablet Take 20 mg by mouth daily.    . SUMAtriptan (IMITREX) 100 MG tablet Take 1 tablet (100 mg total) by mouth once as needed. May repeat in 2 hours if headache persists  or recurs. 10 tablet 12  . Topiramate ER (QUDEXY XR) 200 MG CS24 Take 200 mg by mouth at bedtime. 30 each 11   No current facility-administered medications for this visit.     Allergies as of 12/09/2016  . (No Known Allergies)    Vitals: BP 102/69   Pulse 76   Resp 18   Ht 5\' 7"  (1.702 m)   Wt 162 lb (73.5 kg)   BMI 25.37 kg/m  Last Weight:  Wt Readings from Last 1 Encounters:  12/09/16 162 lb (73.5 kg)   Last Height:   Ht Readings from Last 1 Encounters:  12/09/16 5\' 7"  (1.702 m)   Physical exam: Exam: Gen: NAD, conversant, well nourised, well groomed                     CV: RRR, no MRG. No Carotid Bruits. No peripheral edema, warm, nontender Eyes: Conjunctivae clear without exudates or hemorrhage  Neuro: Detailed Neurologic Exam  Speech:    Speech is normal; fluent and spontaneous with normal comprehension.  Cognition:    The patient is oriented to person, place, and time;     recent and remote memory intact;     language fluent;     normal attention, concentration,     fund of knowledge Cranial Nerves:    The pupils are equal, round, and reactive to light. The fundi are normal and spontaneous venous pulsations are present. Visual fields are full to finger confrontation. Extraocular movements are intact. Trigeminal sensation is intact and the muscles of mastication are normal. The face is symmetric. The palate elevates in the midline. Hearing intact. Voice is normal. Shoulder shrug is normal. The tongue has normal motion without fasciculations.   Coordination:    Normal finger to nose and heel to shin. Normal rapid alternating movements.   Gait:    Heel-toe and tandem gait are normal.   Motor Observation:    No asymmetry, no atrophy, and no involuntary movements noted. Tone:    Normal muscle tone.    Posture:    Posture is normal. normal erect    Strength:    Strength is V/V in the upper and lower limbs.      Sensation: intact to LT     Reflex  Exam:  DTR's: Hypo AJs otherwwise deep tendon reflexes in the upper and lower extremities are normal bilaterally.   Toes:    The toes are downgoing bilaterally.   Clonus:    Clonus is absent.       Assessment/Plan:  This is a very nice 50 year old patient with a history of seizures, migraines who is here for follow-up of both. She is on topiramate 100 mg twice a day there is medication noncompliance leading to breakthrough  seizures and breakthrough migraines. She reports when she takes the medication she is well-controlled. At this time will change to once a day extended release topiramate and see if this will help with compliance. Also will start Imitrex, stop Fioricet. Advised on seizure precautions. Patient is unable to drive, operate heavy machinery, perform activities at heights or participate in water activities until 6 months seizure free. Will request previous neurology records.   Orders Placed This Encounter  Procedures  . Topiramate level     Naomie DeanAntonia Deosha Werden, MD  [facility] [address] [place], [place] [id number]  Phone [phone] Fax [phone]

## 2016-12-09 NOTE — Patient Instructions (Addendum)
Remember to drink plenty of fluid, eat healthy meals and do not skip any meals. Try to eat protein with a every meal and eat a healthy snack such as fruit or nuts in between meals. Try to keep a regular sleep-wake schedule and try to exercise daily, particularly in the form of walking, 20-30 minutes a day, if you can.   As far as your medications are concerned, I would like to suggest: Qudexy (Topiramate) once daily 200mg .  Imitrex(Sumatriptan): Please take one tablet at the onset of your headache. If it does not improve the symptoms please take one additional tablet. Do not take more then 2 tablets in 24hrs. Do not take use more then 2 to 3 times in a week.  As far as diagnostic testing: Lab  I would like to see you back in 3 months, sooner if we need to. Please call us with any interim questions, concerns, problems, updates or refill requests.   Our phone number is [phone]. We also have an after hours call service for urgent matters and there is a physician on-call for urgent questions. For any emergencies you know to call 911 or go to the nearest emergency room   Epilepsy Epilepsy is a condition in which a person has repeated seizures over time. A seizure is a sudden burst of abnormal electrical and chemical activity in the brain. Seizures can cause a change in attention, behavior, or the ability to remain awake and alert (altered mental status). Epilepsy increases a person's risk of falls, accidents, and injury. It can also lead to complications, including:  Depression.  Poor memory.  Sudden unexplained death in epilepsy (SUDEP). This complication is rare, and its cause is not known. Most people with epilepsy lead normal lives. What are the causes? This condition may be caused by:  A head injury.  An injury that happens at birth.  A high fever during childhood.  A stroke.  Bleeding that goes into or around the brain.  Certain medicines and drugs.  Having too little  oxygen for a long period of time.  Abnormal brain development.  Certain infections, such as meningitis and encephalitis.  Brain tumors.  Conditions that are passed along from parent to child (are hereditary). What are the signs or symptoms? Symptoms of a seizure vary greatly from person to person. They include:  Convulsions.  Stiffening of the body.  Involuntary movements of the arms or legs.  Loss of consciousness.  Breathing problems.  Falling suddenly.  Confusion.  Head nodding.  Eye blinking or fluttering.  Lip smacking.  Drooling.  Rapid eye movements.  Grunting.  Loss of bladder control and bowel control.  Staring.  Unresponsiveness. Some people have symptoms right before a seizure happens (aura) and right after a seizure happens. Symptoms of an aura include:  Fear or anxiety.  Nausea.  Feeling like the room is spinning (vertigo).  A feeling of having seen or heard something before (deja vu).  Odd tastes or smells.  Changes in vision, such as seeing flashing lights or spots. Symptoms that follow a seizure include:  Confusion.  Sleepiness.  Headache. How is this diagnosed? This condition is diagnosed based on:  Your symptoms.  Your medical history.  A physical exam.  A neurological exam. A neurological exam is similar to a physical exam. It involves checking your strength, reflexes, coordination, and sensations.  Tests, such as:  An electroencephalogram (EEG). This is a painless test that creates a diagram of your brain waves.  An  MRI of the brain.  A CT scan of the brain.  A lumbar puncture, also called a spinal tap.  Blood tests to check for signs of infection or abnormal blood chemistry. How is this treated? There is no cure for this condition, but treatment can help control seizures. Treatment may involve:  Taking medicines to control seizures. These include medicines to prevent seizures and medicines to stop seizures as  they occur.  Having a device called a vagus nerve stimulator implanted in the chest. The device sends electrical impulses to the vagus nerve and to the brain to prevent seizures. This treatment may be recommended if medicines do not help.  Brain surgery. There are several kinds of surgeries that may be done to stop seizures from happening or to reduce how often seizures happen.  Having regular blood tests. You may need to have blood tests regularly to check that you are getting the right amount of medicine. Once this condition has been diagnosed, it is important to begin treatment as soon as possible. For some people, epilepsy eventually goes away. Follow these instructions at home: Medicines    Take over-the-counter and prescription medicines only as told by your health care provider.  Avoid any substances that may prevent your medicine from working properly, such as alcohol. Activity   Get enough rest. Lack of sleep can make seizures more likely to occur.  Follow instructions from your health care provider about driving, swimming, and doing any other activities that would be dangerous if you had a seizure. Educating others  Teach friends and family what to do if you have a seizure. They should:  Lay you on the ground to prevent a fall.  Cushion your head and body.  Loosen any tight clothing around your neck.  Turn you on your side. If vomiting occurs, this helps keep your airway clear.  Stay with you until you recover.  Not hold you down. Holding you down will not stop the seizure.  Not put anything in your mouth.  Know whether or not you need emergency care. General instructions   Avoid anything that has ever triggered a seizure for you.  Keep a seizure diary. Record what you remember about each seizure, especially anything that might have triggered the seizure.  Keep all follow-up visits as told by your health care provider. This is important. Contact a health care  provider if:  Your seizure pattern changes.  You have symptoms of infection or another illness. This might increase your risk of having a seizure. Get help right away if:  You have a seizure that does not stop after 5 minutes.  You have several seizures in a row without a complete recovery in between seizures.  You have a seizure that makes it harder to breathe.  You have a seizure that is different from previous seizures.  You have a seizure that leaves you unable to speak or use a part of your body.  You did not wake up immediately after a seizure. This information is not intended to replace advice given to you by your health care provider. Make sure you discuss any questions you have with your health care provider. Document Released: 07/19/2005 Document Revised: 02/14/2016 Document Reviewed: 01/27/2016 Elsevier Interactive Patient Education  2017 [employer]   Migraine Headache A migraine headache is an intense, throbbing pain on one side or both sides of the head. Migraines may also cause other symptoms, such as nausea, vomiting, and sensitivity to light and noise. What  are the causes? Doing or taking certain things may also trigger migraines, such as:  Alcohol.  Smoking.  Medicines, such as:  Medicine used to treat chest pain (nitroglycerine).  Birth control pills.  Estrogen pills.  Certain blood pressure medicines.  Aged cheeses, chocolate, or caffeine.  Foods or drinks that contain nitrates, glutamate, aspartame, or tyramine.  Physical activity. Other things that may trigger a migraine include:  Menstruation.  Pregnancy.  Hunger.  Stress, lack of sleep, too much sleep, or fatigue.  Weather changes. What increases the risk? The following factors may make you more likely to experience migraine headaches:  Age. Risk increases with age.  Family history of migraine headaches.  Being Caucasian.  Depression and anxiety.  Obesity.  Being a  woman.  Having a hole in the heart (patent foramen ovale) or other heart problems. What are the signs or symptoms? The main symptom of this condition is pulsating or throbbing pain. Pain may:  Happen in any area of the head, such as on one side or both sides.  Interfere with daily activities.  Get worse with physical activity.  Get worse with exposure to bright lights or loud noises. Other symptoms may include:  Nausea.  Vomiting.  Dizziness.  General sensitivity to bright lights, loud noises, or smells. Before you get a migraine, you may get warning signs that a migraine is developing (aura). An aura may include:  Seeing flashing lights or having blind spots.  Seeing bright spots, halos, or zigzag lines.  Having tunnel vision or blurred vision.  Having numbness or a tingling feeling.  Having trouble talking.  Having muscle weakness. How is this diagnosed? A migraine headache can be diagnosed based on:  Your symptoms.  A physical exam.  Tests, such as CT scan or MRI of the head. These imaging tests can help rule out other causes of headaches.  Taking fluid from the spine (lumbar puncture) and analyzing it (cerebrospinal fluid analysis, or CSF analysis). How is this treated? A migraine headache is usually treated with medicines that:  Relieve pain.  Relieve nausea.  Prevent migraines from coming back. Treatment may also include:  Acupuncture.  Lifestyle changes like avoiding foods that trigger migraines. Follow these instructions at home: Medicines   Take over-the-counter and prescription medicines only as told by your health care provider.  Do not drive or use heavy machinery while taking prescription pain medicine.  To prevent or treat constipation while you are taking prescription pain medicine, your health care provider may recommend that you:  Drink enough fluid to keep your urine clear or pale yellow.  Take over-the-counter or prescription  medicines.  Eat foods that are high in fiber, such as fresh fruits and vegetables, whole grains, and beans.  Limit foods that are high in fat and processed sugars, such as fried and sweet foods. Lifestyle   Avoid alcohol use.  Do not use any products that contain nicotine or tobacco, such as cigarettes and e-cigarettes. If you need help quitting, ask your health care provider.  Get at least 8 hours of sleep every night.  Limit your stress. General instructions    Keep a journal to find out what may trigger your migraine headaches. For example, write down:  What you eat and drink.  How much sleep you get.  Any change to your diet or medicines.  If you have a migraine:  Avoid things that make your symptoms worse, such as bright lights.  It may help to lie down in  a dark, quiet room.  Do not drive or use heavy machinery.  Ask your health care provider what activities are safe for you while you are experiencing symptoms.  Keep all follow-up visits as told by your health care provider. This is important. Contact a health care provider if:  You develop symptoms that are different or more severe than your usual migraine symptoms. Get help right away if:  Your migraine becomes severe.  You have a fever.  You have a stiff neck.  You have vision loss.  Your muscles feel weak or like you cannot control them.  You start to lose your balance often.  You develop trouble walking.  You faint. This information is not intended to replace advice given to you by your health care provider. Make sure you discuss any questions you have with your health care provider. Document Released: 07/19/2005 Document Revised: 02/06/2016 Document Reviewed: 01/05/2016 Elsevier Interactive Patient Education  2017 [employer]  Topiramate extended-release capsules What is this medicine? TOPIRAMATE (toe PYRE a mate) is used to treat seizures in adults or children with epilepsy. It is also  used for the prevention of migraine headaches. This medicine may be used for other purposes; ask your health care provider or pharmacist if you have questions. COMMON BRAND NAME(S): Trokendi XR What should I tell my health care provider before I take this medicine? They need to know if you have any of these conditions: -cirrhosis of the liver or liver disease -diarrhea -glaucoma -kidney stones or kidney disease -lung disease like asthma, obstructive pulmonary disease, emphysema -metabolic acidosis -on a ketogenic diet -scheduled for surgery or a procedure -suicidal thoughts, plans, or attempt; a previous suicide attempt by you or a family member -an unusual or allergic reaction to topiramate, other medicines, foods, dyes, or preservatives -pregnant or trying to get pregnant -breast-feeding How should I use this medicine? Take this medicine by mouth with a glass of water. Follow the directions on the prescription label. Trokendi XR capsules must be swallowed whole. Do not sprinkle on food, break, crush, dissolve, or chew. Qudexy XR capsules may be swallowed whole or opened and sprinkled on a small amount of soft food. This mixture must be swallowed immediately. Do not chew or store mixture for later use. You may take this medicine with meals. Take your medicine at regular intervals. Do not take it more often than directed. Talk to your pediatrician regarding the use of this medicine in children. Special care may be needed. While Trokendi XR may be prescribed for children as young as 6 years and Qudexy XR may be prescribed for children as young as 2 years for selected conditions, precautions do apply. Overdosage: If you think you have taken too much of this medicine contact a poison control center or emergency room at once. NOTE: This medicine is only for you. Do not share this medicine with others. What if I miss a dose? If you miss a dose, take it as soon as you can. If it is almost time for  your next dose, take only that dose. Do not take double or extra doses. What may interact with this medicine? Do not take this medicine with any of the following medications: -probenecid This medicine may also interact with the following medications: -acetazolamide -alcohol -amitriptyline -birth control pills -digoxin -hydrochlorothiazide -lithium -medicines for pain, sleep, or muscle relaxation -metformin -methazolamide -other seizure or epilepsy medicines -pioglitazone -risperidone This list may not describe all possible interactions. Give your health care provider a  list of all the medicines, herbs, non-prescription drugs, or dietary supplements you use. Also tell them if you smoke, drink alcohol, or use illegal drugs. Some items may interact with your medicine. What should I watch for while using this medicine? Visit your doctor or health care professional for regular checks on your progress. Do not stop taking this medicine suddenly. This increases the risk of seizures if you are using this medicine to control epilepsy. Wear a medical identification bracelet or chain to say you have epilepsy or seizures, and carry a card that lists all your medicines. This medicine can decrease sweating and increase your body temperature. Watch for signs of deceased sweating or fever, especially in children. Avoid extreme heat, hot baths, and saunas. Be careful about exercising, especially in hot weather. Contact your health care provider right away if you notice a fever or decrease in sweating. You should drink plenty of fluids while taking this medicine. If you have had kidney stones in the past, this will help to reduce your chances of forming kidney stones. If you have stomach pain, with nausea or vomiting and yellowing of your eyes or skin, call your doctor immediately. You may get drowsy, dizzy, or have blurred vision. Do not drive, use machinery, or do anything that needs mental alertness until  you know how this medicine affects you. To reduce dizziness, do not sit or stand up quickly, especially if you are an older patient. Alcohol can increase drowsiness and dizziness. Avoid alcoholic drinks. Do not drink alcohol for 6 hours before or 6 hours after taking Trokendi XR. If you notice blurred vision, eye pain, or other eye problems, seek medical attention at once for an eye exam. The use of this medicine may increase the chance of suicidal thoughts or actions. Pay special attention to how you are responding while on this medicine. Any worsening of mood, or thoughts of suicide or dying should be reported to your health care professional right away. This medicine may increase the chance of developing metabolic acidosis. If left untreated, this can cause kidney stones, bone disease, or slowed growth in children. Symptoms include breathing fast, fatigue, loss of appetite, irregular heartbeat, or loss of consciousness. Call your doctor immediately if you experience any of these side effects. Also, tell your doctor about any surgery you plan on having while taking this medicine since this may increase your risk for metabolic acidosis. Birth control pills may not work properly while you are taking this medicine. Talk to your doctor about using an extra method of birth control. Women who become pregnant while using this medicine may enroll in the [place] [employer] Antiepileptic Drug Pregnancy Registry by calling [phone]. This registry collects information about the safety of antiepileptic drug use during pregnancy. What side effects may I notice from receiving this medicine? Side effects that you should report to your doctor or health care professional as soon as possible: -allergic reactions like skin rash, itching or hives, swelling of the face, lips, or tongue -decreased sweating and/or rise in body temperature -depression -difficulty breathing, fast or irregular breathing patterns -difficulty  speaking -difficulty walking or controlling muscle movements -hearing impairment -redness, blistering, peeling or loosening of the skin, including inside the mouth -tingling, pain or numbness in the hands or feet -unusually weak or tired -worsening of mood, thoughts or actions of suicide or dying Side effects that usually do not require medical attention (report to your doctor or health care professional if they continue or are bothersome): -altered taste -  back pain, joint or muscle aches and pains -diarrhea, or constipation -headache -loss of appetite -nausea -stomach upset, indigestion -tremors This list may not describe all possible side effects. Call your doctor for medical advice about side effects. You may report side effects to FDA at 1-800-FDA-1088. Where should I keep my medicine? Keep out of the reach of children. Store at room temperature between 15 and 30 degrees C (59 and 86 degrees F) in a tightly closed container. Protect from moisture. Throw away any unused medicine after the expiration date. NOTE: This sheet is a summary. It may not cover all possible information. If you have questions about this medicine, talk to your doctor, pharmacist, or health care provider.  2018 Elsevier/Gold Standard (2015-11-07 12:33:11)  Sumatriptan tablets What is this medicine? SUMATRIPTAN (soo ma TRIP tan) is used to treat migraines with or without aura. An aura is a strange feeling or visual disturbance that warns you of an attack. It is not used to prevent migraines. This medicine may be used for other purposes; ask your health care provider or pharmacist if you have questions. COMMON BRAND NAME(S): Imitrex, Migraine Pack What should I tell my health care provider before I take this medicine? They need to know if you have any of these conditions: -circulation problems in fingers and toes -diabetes -heart disease -high blood pressure -high cholesterol -history of irregular  heartbeat -history of stroke -kidney disease -liver disease -postmenopausal or surgical removal of uterus and ovaries -seizures -smoke tobacco -stomach or intestine problems -an unusual or allergic reaction to sumatriptan, other medicines, foods, dyes, or preservatives -pregnant or trying to get pregnant -breast-feeding How should I use this medicine? Take this medicine by mouth with a glass of water. Follow the directions on the prescription label. This medicine is taken at the first symptoms of a migraine. It is not for everyday use. If your migraine headache returns after one dose, you can take another dose as directed. You must leave at least 2 hours between doses, and do not take more than 100 mg as a single dose. Do not take more than 200 mg total in any 24 hour period. If there is no improvement at all after the first dose, do not take a second dose without talking to your doctor or health care professional. Do not take your medicine more often than directed. Talk to your pediatrician regarding the use of this medicine in children. Special care may be needed. Overdosage: If you think you have taken too much of this medicine contact a poison control center or emergency room at once. NOTE: This medicine is only for you. Do not share this medicine with others. What if I miss a dose? This does not apply; this medicine is not for regular use. What may interact with this medicine? Do not take this medicine with any of the following medicines: -cocaine -ergot alkaloids like dihydroergotamine, ergonovine, ergotamine, methylergonovine -feverfew -MAOIs like Carbex, Eldepryl, Marplan, Nardil, and Parnate -other medicines for migraine headache like almotriptan, eletriptan, frovatriptan, naratriptan, rizatriptan, zolmitriptan -tryptophan This medicine may also interact with the following medications: -certain medicines for depression, anxiety, or psychotic disturbances This list may not  describe all possible interactions. Give your health care provider a list of all the medicines, herbs, non-prescription drugs, or dietary supplements you use. Also tell them if you smoke, drink alcohol, or use illegal drugs. Some items may interact with your medicine. What should I watch for while using this medicine? Only take this medicine for a  migraine headache. Take it if you get warning symptoms or at the start of a migraine attack. It is not for regular use to prevent migraine attacks. You may get drowsy or dizzy. Do not drive, use machinery, or do anything that needs mental alertness until you know how this medicine affects you. To reduce dizzy or fainting spells, do not sit or stand up quickly, especially if you are an older patient. Alcohol can increase drowsiness, dizziness and flushing. Avoid alcoholic drinks. Smoking cigarettes may increase the risk of heart-related side effects from using this medicine. If you take migraine medicines for 10 or more days a month, your migraines may get worse. Keep a diary of headache days and medicine use. Contact your healthcare professional if your migraine attacks occur more frequently. What side effects may I notice from receiving this medicine? Side effects that you should report to your doctor or health care professional as soon as possible: -allergic reactions like skin rash, itching or hives, swelling of the face, lips, or tongue -bloody or watery diarrhea -hallucination, loss of contact with reality -pain, tingling, numbness in the face, hands, or feet -seizures -signs and symptoms of a blood clot such as breathing problems; changes in vision; chest pain; severe, sudden headache; pain, swelling, warmth in the leg; trouble speaking; sudden numbness or weakness of the face, arm, or leg -signs and symptoms of a dangerous change in heartbeat or heart rhythm like chest pain; dizziness; fast or irregular heartbeat; palpitations, feeling faint or  lightheaded; falls; breathing problems -signs and symptoms of a stroke like changes in vision; confusion; trouble speaking or understanding; severe headaches; sudden numbness or weakness of the face, arm, or leg; trouble walking; dizziness; loss of balance or coordination -stomach pain Side effects that usually do not require medical attention (report to your doctor or health care professional if they continue or are bothersome): -changes in taste -facial flushing -headache -muscle cramps -muscle pain -nausea, vomiting -weak or tired This list may not describe all possible side effects. Call your doctor for medical advice about side effects. You may report side effects to FDA at 1-800-FDA-1088. Where should I keep my medicine? Keep out of the reach of children. Store at room temperature between 2 and 30 degrees C (36 and 86 degrees F). Throw away any unused medicine after the expiration date. NOTE: This sheet is a summary. It may not cover all possible information. If you have questions about this medicine, talk to your doctor, pharmacist, or health care provider.  2018 Elsevier/Gold Standard (2015-08-21 12:38:23)

## 2016-12-10 ENCOUNTER — Other Ambulatory Visit: Payer: Medicaid Other

## 2016-12-10 LAB — TOPIRAMATE LEVEL: Topiramate Lvl: NOT DETECTED ug/mL (ref 2.0–25.0)

## 2016-12-11 DIAGNOSIS — G43909 Migraine, unspecified, not intractable, without status migrainosus: Secondary | ICD-10-CM | POA: Insufficient documentation

## 2016-12-11 DIAGNOSIS — G40209 Localization-related (focal) (partial) symptomatic epilepsy and epileptic syndromes with complex partial seizures, not intractable, without status epilepticus: Secondary | ICD-10-CM | POA: Insufficient documentation

## 2016-12-16 ENCOUNTER — Ambulatory Visit (INDEPENDENT_AMBULATORY_CARE_PROVIDER_SITE_OTHER): Payer: Medicaid Other | Admitting: Internal Medicine

## 2016-12-16 ENCOUNTER — Ambulatory Visit (INDEPENDENT_AMBULATORY_CARE_PROVIDER_SITE_OTHER)
Admission: RE | Admit: 2016-12-16 | Discharge: 2016-12-16 | Disposition: A | Discharge status: Home / Self Care | Payer: Medicaid Other | Source: Ambulatory Visit | Attending: Internal Medicine | Admitting: Internal Medicine

## 2016-12-16 ENCOUNTER — Encounter: Payer: Self-pay | Admitting: Internal Medicine

## 2016-12-16 VITALS — BP 102/62 | HR 76 | Ht 67.0 in | Wt 165.0 lb

## 2016-12-16 DIAGNOSIS — Z87891 Personal history of nicotine dependence: Secondary | ICD-10-CM | POA: Diagnosis not present

## 2016-12-16 DIAGNOSIS — J440 Chronic obstructive pulmonary disease with acute lower respiratory infection: Secondary | ICD-10-CM

## 2016-12-16 DIAGNOSIS — J209 Acute bronchitis, unspecified: Secondary | ICD-10-CM

## 2016-12-16 DIAGNOSIS — Z8709 Personal history of other diseases of the respiratory system: Secondary | ICD-10-CM

## 2016-12-16 MED ORDER — CEPHALEXIN 500 MG PO CAPS: 500.0000 mg | ORAL_CAPSULE | Freq: Three times a day (TID) | ORAL | 0 refills | Status: DC

## 2016-12-16 MED ORDER — PREDNISONE 10 MG PO TABS: ORAL_TABLET | ORAL | 0 refills | Status: DC

## 2016-12-16 NOTE — Progress Notes (Signed)
Subjective:    Patient ID: Mary Mclaughlin, female    DOB: Jul 28, 1966, 51 y.o.   MRN: [id number]  PCP Lizbeth Bark, FNP  HPI   IOV 12/16/2016  Chief Complaint  Patient presents with  . Pulmonary Consult    referred by Dr Arrie Senate to establish for COPD since moving to [facility] from [place] 07/2016.  breathing is at baseline    51 year old female who has relocated from the [place] area to [facility] in December 2017. Reports that she has a diagnosis of COPD greater than 2014 after 2 bouts of double pneumonia. Since then she's been on [facility]. Than a year ago she had pulmonary function test following referral to a pulmonologist. And was told that she has "touch  of emphysema" Visual then asked to continue her Dulera. She says overall in the last few years she's been stable with his baseline shortness of breath for class II-III activities such as climbing a flight of stairs. Relieved by rest. Occasionally she has some wheezing this relieved by albuterol for rescue. This past week for cough and wheezing more and she feels she'll benefit from prednisone. She does not recollect CT scan of the chest. She's had a chest x-ray in [place]. She says old records was sent from  but I do not have them.    Results for TWANDA, STAKES (MRN [id number]) as of 12/16/2016 12:25  Ref. Range 10/12/2016 16:43 10/12/2016 16:59 10/21/2016 09:52 12/09/2016 08:36  Creatinine Latest Ref Range: 0.50 - 1.10 mg/dL 1.88     Results for JACQUI, HEADEN (MRN [id number]) as of 12/16/2016 12:25  Ref. Range 10/12/2016 16:43 10/12/2016 16:59 10/21/2016 09:52 12/09/2016 08:36  Hemoglobin Latest Ref Range: 11.7 - 15.5 g/dL 60.1        has a past medical history of COPD (chronic obstructive pulmonary disease) (HCC); Headache; and Seizures (HCC).   reports that she has been smoking Cigarettes.  She has a 10.00 pack-year smoking history. She has never used smokeless tobacco.  Past Surgical History:  Procedure  Laterality Date  . LEEP      No Known Allergies   There is no immunization history on file for this patient.  Family History  Problem Relation Age of Onset  . Hypertension Mother   . Cancer Father   . Emphysema Father   . Seizures Neg Hx      Current Outpatient Prescriptions:  .  mometasone-formoterol (DULERA) 100-5 MCG/ACT AERO, Inhale 2 puffs into the lungs 2 (two) times daily., Disp: 13 g, Rfl: 2 .  nicotine (NICODERM CQ) 21 mg/24hr patch, Place 1 patch (21 mg total) onto the skin daily., Disp: 28 patch, Rfl: 1 .  pantoprazole (PROTONIX) 20 MG tablet, Take 20 mg by mouth daily., Disp: , Rfl:  .  SUMAtriptan (IMITREX) 100 MG tablet, Take 1 tablet (100 mg total) by mouth once as needed. May repeat in 2 hours if headache persists or recurs., Disp: 10 tablet, Rfl: 12 .  Topiramate ER (QUDEXY XR) 200 MG CS24, Take 200 mg by mouth at bedtime. (Patient not taking: Reported on 12/16/2016), Disp: 30 each, Rfl: 11    Review of Systems  Constitutional: Negative for fever and unexpected weight change.  HENT: Positive for sneezing. Negative for congestion, dental problem, ear pain, nosebleeds, postnasal drip, rhinorrhea, sinus pressure, sore throat and trouble swallowing.   Eyes: Negative for redness and itching.  Respiratory: Positive for cough, shortness of breath and wheezing. Negative for chest tightness.   Cardiovascular: Negative for  palpitations and leg swelling.  Gastrointestinal: Negative for nausea and vomiting.  Genitourinary: Negative for dysuria.  Musculoskeletal: Negative for joint swelling.  Skin: Negative for rash.  Neurological: Positive for headaches.  Hematological: Does not bruise/bleed easily.  Psychiatric/Behavioral: Negative for dysphoric mood. The patient is not nervous/anxious.        Objective:   Physical Exam  Constitutional: She is oriented to person, place, and time. She appears well-developed and well-nourished. No distress.  HENT:  Head:  Normocephalic and atraumatic.  Right Ear: External ear normal.  Left Ear: External ear normal.  Mouth/Throat: Oropharynx is clear and moist. No oropharyngeal exudate.  Eyes: Conjunctivae and EOM are normal. Pupils are equal, round, and reactive to light. Right eye exhibits no discharge. Left eye exhibits no discharge. No scleral icterus.  Neck: Normal range of motion. Neck supple. No JVD present. No tracheal deviation present. No thyromegaly present.  Cardiovascular: Normal rate, regular rhythm, normal heart sounds and intact distal pulses.  Exam reveals no gallop and no friction rub.   No murmur heard. Pulmonary/Chest: Effort normal and breath sounds normal. No respiratory distress. She has no wheezes. She has no rales. She exhibits no tenderness.  Abdominal: Soft. Bowel sounds are normal. She exhibits no distension and no mass. There is no tenderness. There is no rebound and no guarding.  Musculoskeletal: Normal range of motion. She exhibits no edema or tenderness.  Lymphadenopathy:    She has no cervical adenopathy.  Neurological: She is alert and oriented to person, place, and time. She has normal reflexes. No cranial nerve deficit. She exhibits normal muscle tone. Coordination normal.  Skin: Skin is warm and dry. No rash noted. She is not diaphoretic. No erythema. No pallor.  Psychiatric: She has a normal mood and affect. Her behavior is normal. Judgment and thought content normal.  Vitals reviewed.   Vitals:   12/16/16 1222  BP: 102/62  Pulse: 76  SpO2: 98%  Weight: 165 lb (74.8 kg)  Height: 5\' 7"  (1.702 m)    Body mass index is 25.84 kg/m.       Assessment & Plan:     ICD-9-CM ICD-10-CM   1. History of COPD V12.69 Z87.09   2. Acute bronchitis with COPD (HCC) 491.22 J44.0     J20.9   3. History of cigarette smoking V15.82 Z87.891    Odd with 10pack smoking and 48 years she has "copd". Will need to see what PFT shows/ CXR shows.   PLAN  Please take prednisone 40 mg  x1 day, then 30 mg x1 day, then 20 mg x1 day, then 10 mg x1 day, and then 5 mg x1 day and stop  Start and take cephalexin 500mg  three times daily x  5 days   Continue dulera  Do cxr 2 view  Do full PFT nexdt few days to few weeks  Followup Return to see Maralyn SagoSarah or Tammy after PFT test   -      Dr. Kalman ShanMurali Miette Molenda, M.D., [facility] Center[facility]C.P Pulmonary and Critical Care Medicine Staff Physician [facility] Pager: [phone], If no answer or between  15:00h - 7:00h: call [phone]  12/16/2016 12:35 PM

## 2016-12-16 NOTE — Patient Instructions (Signed)
ICD-9-CM ICD-10-CM   1. History of COPD V12.69 Z87.09   2. Acute bronchitis with COPD (HCC) 491.22 J44.0     J20.9   3. History of cigarette smoking V15.82 Z87.891     Please take prednisone 40 mg x1 day, then 30 mg x1 day, then 20 mg x1 day, then 10 mg x1 day, and then 5 mg x1 day and stop  Start and take cephalexin 500mg  three times daily x  5 days   Continue dulera  Do cxr 2 view  Do full PFT nexdt few days to few weeks  Followup Return to see Maralyn SagoSarah or Tammy after PFT test

## 2016-12-17 NOTE — Progress Notes (Signed)
Called and spoke to pt. Informed her of the results per MR. Pt verbalized understanding and denied any further questions or concerns at this time.  

## 2017-01-03 ENCOUNTER — Ambulatory Visit: Payer: Medicaid Other | Admitting: Acute Care

## 2017-01-05 ENCOUNTER — Ambulatory Visit: Payer: Medicaid Other | Admitting: Acute Care

## 2017-01-12 ENCOUNTER — Telehealth: Payer: Self-pay | Admitting: Internal Medicine

## 2017-01-12 NOTE — Telephone Encounter (Signed)
Contacted patient to see if she could come in today to have PFT performed prior to 6/14 OV per MR note. Pt was did not answer and message was left to contact office.

## 2017-01-13 ENCOUNTER — Ambulatory Visit: Payer: Medicaid Other | Admitting: Acute Care

## 2017-01-16 ENCOUNTER — Encounter (HOSPITAL_COMMUNITY): Payer: Self-pay | Admitting: Emergency Medicine

## 2017-01-16 ENCOUNTER — Emergency Department (HOSPITAL_COMMUNITY)
Admission: EM | Admit: 2017-01-16 | Discharge: 2017-01-16 | Disposition: A | Discharge status: Home / Self Care | Payer: Medicaid Other | Attending: Emergency Medicine | Admitting: Emergency Medicine

## 2017-01-16 DIAGNOSIS — R55 Syncope and collapse: Secondary | ICD-10-CM | POA: Insufficient documentation

## 2017-01-16 DIAGNOSIS — Z79899 Other long term (current) drug therapy: Secondary | ICD-10-CM | POA: Insufficient documentation

## 2017-01-16 DIAGNOSIS — R42 Dizziness and giddiness: Secondary | ICD-10-CM | POA: Diagnosis present

## 2017-01-16 DIAGNOSIS — F17210 Nicotine dependence, cigarettes, uncomplicated: Secondary | ICD-10-CM | POA: Diagnosis not present

## 2017-01-16 DIAGNOSIS — J449 Chronic obstructive pulmonary disease, unspecified: Secondary | ICD-10-CM | POA: Diagnosis not present

## 2017-01-16 NOTE — ED Provider Notes (Signed)
[facility]-EMERGENCY DEPT Provider Note   CSN: [id number] Arrival date & time: 01/16/17  1347  By signing my name below, I, [name], attest that this documentation has been prepared under the direction and in the presence of [name], PA-C. Electronically Signed: Linna Darnerussell Turner, Scribe. 01/16/2017. 2:47 PM.  History   Chief Complaint Chief Complaint  Patient presents with  . Near Syncope   The history is provided by the patient. No language interpreter was used.    HPI Comments: Mary Mclaughlin is a 51 y.o. female with PMHx of seizures who presents to the Emergency Department for evaluation s/p an episode of lightheadedness and near-syncope that occurred about half an hour ago. She was here in the ED while her daughter was having an I&D performed and suddenly felt lightheaded while sitting. She subsequently tried to stand up, became more lightheaded, and fell to the floor but specifically denies losing consciousness or sustaining any head trauma. Patient notes her lightheadedness resolved shortly thereafter. Patient states she is not squeamish and does not believe the procedure was related to her lightheadedness, however the smell of the abscess was strong. She has a h/o similar episodes of lightheadedness that last for a few minutes and ultimately resolve on their own. Patient notes her symptoms today were not consistent with her history of seizures. She denies numbness/tingling, focal weakness, abdominal pain, nausea, vomiting, chest pain, dyspnea, or any other associated symptoms. Patient intends to follow-up with her neurologist.  Past Medical History:  Diagnosis Date  . COPD (chronic obstructive pulmonary disease) (HCC)   . Headache   . Seizures [facility](HCC)     Patient Active Problem List   Diagnosis Date Noted  . 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 Migraine headache 12/11/2016    Past Surgical History:  Procedure Laterality Date  . LEEP    . TUBAL  LIGATION      OB History    No data available       Home Medications    Prior to Admission medications   Medication Sig Start Date End Date Taking? Authorizing Provider  cephALEXin (KEFLEX) 500 MG capsule Take 1 capsule (500 mg total) by mouth 3 (three) times daily. 12/16/16   Kalman Shanamaswamy, Murali, MD  mometasone-formoterol (DULERA) 100-5 MCG/ACT AERO Inhale 2 puffs into the lungs 2 (two) times daily. 10/12/16   Lizbeth BarkHairston, Mandesia R, FNP  nicotine (NICODERM CQ) 21 mg/24hr patch Place 1 patch (21 mg total) onto the skin daily. 11/24/16   Lizbeth BarkHairston, Mandesia R, FNP  pantoprazole (PROTONIX) 20 MG tablet Take 20 mg by mouth daily.    [provider]  predniSONE (DELTASONE) 10 MG tablet 4 tabs X1 day, 3 tabs X1 day, 2 tabs X1 day, 1 tab X1 day, 0.5 tab X1 day, then stop. 12/16/16   Kalman Shanamaswamy, Murali, MD  SUMAtriptan (IMITREX) 100 MG tablet Take 1 tablet (100 mg total) by mouth once as needed. May repeat in 2 hours if headache persists or recurs. 12/09/16   Anson FretAhern, Antonia B, MD  Topiramate ER (QUDEXY XR) 200 MG CS24 Take 200 mg by mouth at bedtime. Patient not taking: Reported on 12/16/2016 12/09/16   Anson FretAhern, Antonia B, MD    Family History Family History  Problem Relation Age of Onset  . Hypertension Mother   . Cancer Father   . Emphysema Father   . Seizures Neg Hx     Social History Social History  Substance Use Topics  . Smoking status: Current Every Day Smoker  Packs/day: 0.50    Years: 20.00    Types: Cigarettes  . Smokeless tobacco: Never Used  . Alcohol use 1.2 oz/week    2 Cans of beer per week     Comment: drinks beer once a week     Allergies   Patient has no known allergies.   Review of Systems Review of Systems  Respiratory: Negative for shortness of breath.   Cardiovascular: Negative for chest pain.  Gastrointestinal: Negative for nausea and vomiting.  Skin: Negative for wound.  Neurological: Positive for light-headedness. Negative for syncope, weakness  and numbness.   Physical Exam Updated Vital Signs BP 104/78 (BP Location: Left Arm)   Pulse 70   Resp 18   Wt 76.2 kg (168 lb)   LMP 01/16/2017 Comment: pt gets very lightheaded on her menses  SpO2 100%   BMI 26.31 kg/m   Physical Exam  Constitutional: She appears well-developed and well-nourished. No distress.  HENT:  Head: Normocephalic and atraumatic.  Mouth/Throat: Oropharynx is clear and moist. No oropharyngeal exudate.  Eyes: Conjunctivae are normal. Pupils are equal, round, and reactive to light. Right eye exhibits no discharge. Left eye exhibits no discharge. No scleral icterus.  Neck: Normal range of motion. Neck supple. No thyromegaly present.  Cardiovascular: Normal rate, regular rhythm, normal heart sounds and intact distal pulses.  Exam reveals no gallop and no friction rub.   No murmur heard. Pulmonary/Chest: Effort normal and breath sounds normal. No stridor. No respiratory distress. She has no wheezes. She has no rales.  Abdominal: Soft. Bowel sounds are normal. She exhibits no distension. There is no tenderness. There is no rebound and no guarding.  Musculoskeletal: She exhibits no edema.  No midline C, T, or L spine tenderness.  Lymphadenopathy:    She has no cervical adenopathy.  Neurological: She is alert. Coordination normal.  CN 3-12 intact; normal sensation throughout; 5/5 strength in all 4 extremities; equal bilateral grip strength. No ataxia on finger-to-nose.  Skin: Skin is warm and dry. No rash noted. She is not diaphoretic. No pallor.  Psychiatric: She has a normal mood and affect.  Nursing note and vitals reviewed.  ED Treatments / Results  Labs (all labs ordered are listed, but only abnormal results are displayed) Labs Reviewed - No data to display  EKG  EKG Interpretation None       Date: 01/16/2017  Rate: 71  Rhythm: normal sinus rhythm  QRS Axis: normal  Intervals: normal  ST/T Wave abnormalities: normal  Conduction Disutrbances:  none  Narrative Interpretation:   Old EKG Reviewed: unable  Radiology No results found.  Procedures Procedures (including critical care time)  DIAGNOSTIC STUDIES: Oxygen Saturation is 99% on RA, normal by my interpretation.    COORDINATION OF CARE: 2:44 PM Discussed treatment plan with pt at bedside and pt agreed to plan.  Medications Ordered in ED Medications - No data to display   Initial Impression / Assessment and Plan / ED Course  I have reviewed the triage vital signs and the nursing notes.  Pertinent labs & imaging results that were available during my care of the patient were reviewed by me and considered in my medical decision making (see chart for details).     Patient with lightheadedness, near-syncope, and collapsed witnessed by myself. Patient did not lose consciousness. She did not hit her head. Patient is feeling back to baseline. Orthostatics are stable. EKG shows NSR. Patient did have a lower blood pressure documented in the ED, 104/78. Patient  reports having to take salt tablets in the past. I feel patient may have become orthostatic or vasovagal due to the smell in the room due to the incision and drainage. I offered patient a workup including labs and she declined. She states she would like to follow up with her neurologist and/or PCP instead. Patient given strict return precautions. Patient vitals stable and discharged in satisfactory condition.  Final Clinical Impressions(s) / ED Diagnoses   Final diagnoses:  Near syncope    New Prescriptions Discharge Medication List as of 01/16/2017  2:46 PM     I personally performed the services described in this documentation, which was scribed in my presence. The recorded information has been reviewed and is accurate.    Emi Holes, PA-C 01/16/17 2009    Jacalyn Lefevre, MD 01/24/17 [id number]

## 2017-01-16 NOTE — Discharge Instructions (Signed)
Please call your doctor tomorrow to be seen and evaluated for your symptoms. Make sure to drink plenty of water and to increase the salt in your diet over the next few days. Please return to emergency department immediately if you develop any new or worsening symptoms.

## 2017-01-16 NOTE — ED Triage Notes (Addendum)
Pt stated that she felt light headed and eased herself to the floor. Pt was observing a minor surgical procedure att her daughters bedside.PA assisted the  pt to floor. Pt was very shaky, denies LOC. PA stated that there was no LOC, just tremors.VS 112/80. HR 109. Sat 100.  Pt stated that she gets lightheaded while on her period. Currently on day one of her cycle.

## 2017-02-14 ENCOUNTER — Other Ambulatory Visit: Payer: Self-pay | Admitting: Acute Care

## 2017-02-14 ENCOUNTER — Ambulatory Visit (INDEPENDENT_AMBULATORY_CARE_PROVIDER_SITE_OTHER): Payer: Medicaid Other | Admitting: Acute Care

## 2017-02-14 ENCOUNTER — Ambulatory Visit (INDEPENDENT_AMBULATORY_CARE_PROVIDER_SITE_OTHER): Payer: Medicaid Other | Admitting: Internal Medicine

## 2017-02-14 ENCOUNTER — Encounter: Payer: Self-pay | Admitting: Acute Care

## 2017-02-14 ENCOUNTER — Telehealth: Payer: Self-pay | Admitting: Acute Care

## 2017-02-14 DIAGNOSIS — Z8709 Personal history of other diseases of the respiratory system: Secondary | ICD-10-CM

## 2017-02-14 DIAGNOSIS — F17210 Nicotine dependence, cigarettes, uncomplicated: Secondary | ICD-10-CM | POA: Diagnosis not present

## 2017-02-14 DIAGNOSIS — J209 Acute bronchitis, unspecified: Secondary | ICD-10-CM

## 2017-02-14 DIAGNOSIS — R0609 Other forms of dyspnea: Secondary | ICD-10-CM | POA: Insufficient documentation

## 2017-02-14 DIAGNOSIS — Z720 Tobacco use: Secondary | ICD-10-CM | POA: Insufficient documentation

## 2017-02-14 DIAGNOSIS — J440 Chronic obstructive pulmonary disease with acute lower respiratory infection: Secondary | ICD-10-CM

## 2017-02-14 DIAGNOSIS — J410 Simple chronic bronchitis: Secondary | ICD-10-CM | POA: Diagnosis not present

## 2017-02-14 DIAGNOSIS — Z87891 Personal history of nicotine dependence: Secondary | ICD-10-CM

## 2017-02-14 LAB — PULMONARY FUNCTION TEST
DL/VA % pred: 94 %
DL/VA: 4.81 ml/min/mmHg/L
DLCO COR: 21.97 ml/min/mmHg
DLCO cor % pred: 79 %
DLCO unc % pred: 77 %
DLCO unc: 21.48 ml/min/mmHg
FEF 25-75 Post: 2.29 L/sec
FEF 25-75 Pre: 1.4 L/sec
FEF2575-%CHANGE-POST: 63 %
FEF2575-%PRED-PRE: 52 %
FEF2575-%Pred-Post: 85 %
FEV1-%Change-Post: 16 %
FEV1-%Pred-Post: 93 %
FEV1-%Pred-Pre: 80 %
FEV1-Post: 2.41 L
FEV1-Pre: 2.08 L
FEV1FVC-%CHANGE-POST: 15 %
FEV1FVC-%PRED-PRE: 85 %
FEV6-%Change-Post: 2 %
FEV6-%PRED-PRE: 93 %
FEV6-%Pred-Post: 96 %
FEV6-Post: 3 L
FEV6-Pre: 2.93 L
FEV6FVC-%Change-Post: 0 %
FEV6FVC-%Pred-Post: 103 %
FEV6FVC-%Pred-Pre: 102 %
FVC-%Change-Post: 0 %
FVC-%PRED-POST: 93 %
FVC-%PRED-PRE: 92 %
FVC-POST: 3 L
FVC-PRE: 2.98 L
PRE FEV1/FVC RATIO: 70 %
PRE FEV6/FVC RATIO: 99 %
Post FEV1/FVC ratio: 80 %
Post FEV6/FVC ratio: 100 %
RV % pred: 99 %
RV: 1.89 L
TLC % pred: 87 %
TLC: 4.77 L

## 2017-02-14 MED ORDER — NICOTINE 10 MG IN INHA: 1.0000 | RESPIRATORY_TRACT | 0 refills | Status: DC | PRN

## 2017-02-14 MED ORDER — VARENICLINE TARTRATE 0.5 MG X 11 & 1 MG X 42 PO MISC: ORAL | 0 refills | Status: DC

## 2017-02-14 MED ORDER — ALBUTEROL SULFATE HFA 108 (90 BASE) MCG/ACT IN AERS: 1.0000 | INHALATION_SPRAY | Freq: Four times a day (QID) | RESPIRATORY_TRACT | 3 refills | Status: AC | PRN

## 2017-02-14 NOTE — Patient Instructions (Addendum)
It is nice to meet you today. Continue working on quitting smoking. Start using your Dulera 2 puffs twice daily every day without fail. This is your maintenance inhaler. Rinse mouth after use. We will prescribe you a rescue inhaler. Use this for breakthrough shortness of breath as needed up to every 6 hours. We will place an order for a Nicotrol inhaler. Please utilize this to assist in your smoking cessation goals 3 [facility]  [facility] Maralyn SagoSarah NP. Please contact office for sooner follow up if symptoms do not improve or worsen or seek emergency care

## 2017-02-14 NOTE — Progress Notes (Signed)
PFT was done today.  

## 2017-02-14 NOTE — Assessment & Plan Note (Signed)
PFTs with significant bronchodilator response( ? Asthma) FEV1/FVC ratio actually equal 70% percent predicted equals 85% Plan Continue working on quitting smoking. Start using your Dulera 2 puffs twice daily every day without fail. This is your maintenance inhaler. Rinse mouth after use. We will prescribe you a rescue inhaler. Use this for breakthrough shortness of breath as needed up to every 6 hours. 3 month follow up with Dr. Craige CottaSood or Maralyn SagoSarah NP. Please contact office for sooner follow up if symptoms do not improve or worsen or seek emergency care

## 2017-02-14 NOTE — Assessment & Plan Note (Signed)
Current every day smoker States she smokes about 10 cigarettes daily Interest in quitting Plan Smoking cessation is the single most powerful action you can take to decrease her risk of lung cancer. Please stop smoking Provided with be stronger than the excuses card with resources in the community assist in smoking cessation Prescription for Nicotrol inhaler to assist in smoking cessation goals

## 2017-02-14 NOTE — Telephone Encounter (Signed)
Spoke with pt. States that she saw SG today and was given Chantix. Pt is worried about the side effects because she has a history of seizures.  SG - please advise. Thanks.

## 2017-02-14 NOTE — Assessment & Plan Note (Signed)
Resolved after treatment with prednisone taper and antibiotic Plan Continue working on quitting smoking. Start using your Dulera 2 puffs twice daily every day without fail. This is your maintenance inhaler. Rinse mouth after use. We will prescribe you a rescue inhaler. Use this for breakthrough shortness of breath as needed up to every 6 hours. We will place an order for a Nicotrol inhaler. Please utilize this to assist in your smoking cessation goals 3 month follow up with Dr. Marchelle Gearingamaswamy  or Maralyn SagoSarah NP. Please contact office for sooner follow up if symptoms do not improve or worsen or seek emergency care

## 2017-02-14 NOTE — Telephone Encounter (Signed)
Please tell patient that if she is concerned about seizures not take the medication. Please call in a Nicotrol inhaler for use to help her with smoking cessation. I would have her discuss the use of Chantix  or Wellbutrin in the future with her PCP at next visit in order to assist with smoking cessation if she is still unable to quit using the Nicotrol inhaler. Thank you.

## 2017-02-14 NOTE — Telephone Encounter (Signed)
Spoke with pt. She is aware of SG's recommendation. Rx has been sent in. Nothing further was needed. 

## 2017-02-14 NOTE — Progress Notes (Signed)
History of Present Illness Mary MageLisa Mclaughlin is a 51 y.o. female current smoker with ? asthma responsive to BD's. She is followed by Dr. Marchelle Gearingamaswamy.    02/14/2017 Follow Up: Pt. presents for follow up. She had been seen by Dr. Ardyth Manram swami on 12/16/2016 for acute bronchitis. At that time she was treated with a prednisone taper and cephalexin 500 mg 3 times a day for 5 days. She was also [facility] PFTs were ordered. She returns today stating she is feeling much better. She completed her prednisone taper and antibiotic. She had PFT's prior to her visit which are essentially normal.( FEV1/FVC ratio is 70, 85% predicted) She has not been using her maintenance inhaler. She does state she has significant dyspnea with exertion. She states even with walking a short distance. There are no triggers that she can tell. She does have an occasional cough, it is non-productive . She denies any secretions.She states her dyspnea  is worse in the heat.She is continuing to smoke about 10 cigarettes daily. She is interested in quitting. I have given her the Be Stronger than Your Excuses card with community resources.She denies any chest pain or fever, orthopnea or hemoptysis..   Test Results: PFTs 02/14/2017>> FEV1/FVC ratio: Actual 70%, percent predicted 85% , FVC 2.98 ,  92, % predicted, TLC 4.77 or 87% predicted, DLCO 21.48 or 77% predicted, DL/PA 1.614.81 or 09%94% predicted, BD response percent change FEV1 +16% change FEV1/FVC +15.  CBC Latest Ref Rng & Units 10/12/2016  WBC 3.8 - 10.8 K/uL 7.3  Hemoglobin 11.7 - 15.5 g/dL 60.413.6  Hematocrit 54.035.0 - 45.0 % 42.0  Platelets 140 - 400 K/uL 308    BMP Latest Ref Rng & Units 10/12/2016  Glucose 65 - 99 mg/dL 84  BUN 7 - 25 mg/dL 11  Creatinine 9.810.50 - 1.911.10 mg/dL 4.780.95  Sodium 295135 - 621146 mmol/L 139  Potassium 3.5 - 5.3 mmol/L 4.2  Chloride 98 - 110 mmol/L 102  CO2 20 - 31 mmol/L 22  Calcium 8.6 - 10.2 mg/dL 30.810.1     PFT    Component  Value Date/Time   FEV1PRE 2.08 02/14/2017 0838   FEV1POST 2.41 02/14/2017 0838   FVCPRE 2.98 02/14/2017 0838   FVCPOST 3.00 02/14/2017 0838   TLC 4.77 02/14/2017 0838   DLCOUNC 21.48 02/14/2017 0838   PREFEV1FVCRT 70 02/14/2017 0838   PSTFEV1FVCRT 80 02/14/2017 0838     Past medical hx Past Medical History:  Diagnosis Date  . COPD (chronic obstructive pulmonary disease) (HCC)   . Headache   . Seizures (HCC)      Social History  Substance Use Topics  . Smoking status: Current Every Day Smoker    Packs/day: 0.50    Years: 20.00    Types: Cigarettes  . Smokeless tobacco: Never Used  . Alcohol use 1.2 oz/week    2 Cans of beer per week     Comment: drinks beer once a week    Tobacco Cessation: Current every day smoker, smokes 10 cigarettes daily. No pack year smoking history documented correctly in Epic  I have spent 5 minutes counseling patient on smoking cessation this visit.  Past surgical hx, Family hx, Social hx all reviewed.  Current Outpatient Prescriptions on File Prior to Visit  Medication Sig  . mometasone-formoterol (DULERA) 100-5 MCG/ACT AERO Inhale 2 puffs into the lungs 2 (two) times daily.  . Topiramate ER (QUDEXY XR) 200 MG CS24 Take 200 mg by  mouth at bedtime.   No current facility-administered medications on file prior to visit.      No Known Allergies  Review Of Systems:  Constitutional:   No  weight loss, night sweats,  Fevers, chills, fatigue, or  lassitude.  HEENT:   No headaches,  Difficulty swallowing,  Tooth/dental problems, or  Sore throat,                No sneezing, itching, ear ache, nasal congestion, post nasal drip,   CV:  No chest pain,  Orthopnea, PND, swelling in lower extremities, anasarca, dizziness, palpitations, syncope.   GI  No heartburn, indigestion, abdominal pain, nausea, vomiting, diarrhea, change in bowel habits, loss of appetite, bloody stools.   Resp: + shortness of breath with exertion not  at rest.  No excess  mucus, no productive cough,  + occasional non-productive cough,  No coughing up of blood.  No change in color of mucus.  No wheezing.  No chest wall deformity  Skin: no rash or lesions.  GU: no dysuria, change in color of urine, no urgency or frequency.  No flank pain, no hematuria   MS:  No joint pain or swelling.  No decreased range of motion.  No back pain.  Psych:  No change in mood or affect. No depression or anxiety.  No memory loss.   Vital Signs BP 118/74 (BP Location: Left Arm, Patient Position: Sitting, Cuff Size: Normal)   Pulse 76   Ht 5\' 7"  (1.702 m)   Wt 160 lb (72.6 kg)   LMP 01/16/2017 Comment: pt gets very lightheaded on her menses  SpO2 98%   BMI 25.06 kg/m    Physical Exam:  General- No distress,  A&Ox3, pleasant ENT: No sinus tenderness, TM clear, pale nasal mucosa, no oral exudate,no post nasal drip, no LAN Cardiac: S1, S2, regular rate and rhythm, no murmur Chest: No wheeze/ rales/ dullness; no accessory muscle use, no nasal flaring, no sternal retractions Abd.: Soft Non-tender, nondistended, bowel sounds positive Ext: No clubbing cyanosis, edema Neuro:  normal strength Skin: No rashes, warm and dry Psych: normal mood and behavior   Assessment/Plan  Dyspnea on exertion  PFTs with significant bronchodilator response( ? Asthma) FEV1/FVC ratio actually equal 70% percent predicted equals 85% Plan Continue working on quitting smoking. Start using your Dulera 2 puffs twice daily every day without fail. This is your maintenance inhaler. Rinse mouth after use. We will prescribe you a rescue inhaler. Use this for breakthrough shortness of breath as needed up to every 6 hours. 3 month follow up with Dr. Craige Cotta or Maralyn Sago NP. Please contact office for sooner follow up if symptoms do not improve or worsen or seek emergency care     Tobacco abuse Current every day smoker States she smokes about 10 cigarettes daily Interest in quitting Plan Smoking  cessation is the single most powerful action you can take to decrease her risk of lung cancer. Please stop smoking Provided with be stronger than the excuses card with resources in the community assist in smoking cessation Prescription for Nicotrol inhaler to assist in smoking cessation goals  Simple chronic bronchitis (HCC) Resolved after treatment with prednisone taper and antibiotic Plan Continue working on quitting smoking. Start using your Dulera 2 puffs twice daily every day without fail. This is your maintenance inhaler. Rinse mouth after use. We will prescribe you a rescue inhaler. Use this for breakthrough shortness of breath as needed up to every 6 hours. We will place an  order for a Nicotrol inhaler. Please utilize this to assist in your smoking cessation goals 3 month follow up with Dr. Marchelle Gearing  or Maralyn Sago NP. Please contact office for sooner follow up if symptoms do not improve or worsen or seek emergency care       Bevelyn Ngo, NP 02/14/2017  4:46 PM

## 2017-02-15 ENCOUNTER — Telehealth: Payer: Self-pay | Admitting: *Deleted

## 2017-02-15 NOTE — Telephone Encounter (Signed)
The patient has a history of seizures, therefore Chantix, Wellbutrin and Zyban are is contraindicated.Please call her and let her know that the request for the Nicotrol inhaler has been denied by her insurance. Please have her follow-up with her PCP on whether or not she would like to try the Wellbutrin or Zyban based on her history of seizures. Please have her attempt to use the reduce to quit technique I discussed with her in the office visit yesterday until she can get clearance for a pharmacologic smoking cessation aid. Please remind her that she can use the Nicotrol patch, gum or mints. Thank so much.

## 2017-02-15 NOTE — Telephone Encounter (Signed)
Spoke with patient. She is aware of the denial. She stated that she has an appt with her PCP tomorrow and will discuss the wellbutrin with them. She said she appreciates all of Sarah's hard work with helping her to stop smoking. Nothing else was needed at time of call.

## 2017-02-15 NOTE — Telephone Encounter (Signed)
Attempted PA thru [facility] for the Nicotrol Inhaler. Pt must try and failed 2 of the below before the Nicrotrol inhaler approved.  Ref # [id number]   Chantix, Zyban or Wellbutrin SR.

## 2017-02-16 ENCOUNTER — Other Ambulatory Visit: Payer: Self-pay

## 2017-02-16 ENCOUNTER — Ambulatory Visit: Payer: Medicaid Other | Attending: Family Medicine | Admitting: Family Medicine

## 2017-02-16 ENCOUNTER — Encounter: Payer: Self-pay | Admitting: Family Medicine

## 2017-02-16 ENCOUNTER — Ambulatory Visit (HOSPITAL_COMMUNITY)
Admission: RE | Admit: 2017-02-16 | Discharge: 2017-02-16 | Disposition: A | Discharge status: Home / Self Care | Payer: Medicaid Other | Source: Ambulatory Visit | Attending: Family Medicine | Admitting: Family Medicine

## 2017-02-16 VITALS — BP 106/71 | HR 65 | Temp 98.2°F | Resp 18 | Ht 67.0 in | Wt 161.4 lb

## 2017-02-16 DIAGNOSIS — Z87898 Personal history of other specified conditions: Secondary | ICD-10-CM

## 2017-02-16 DIAGNOSIS — Z8669 Personal history of other diseases of the nervous system and sense organs: Secondary | ICD-10-CM | POA: Diagnosis not present

## 2017-02-16 DIAGNOSIS — R1011 Right upper quadrant pain: Secondary | ICD-10-CM | POA: Diagnosis not present

## 2017-02-16 DIAGNOSIS — R55 Syncope and collapse: Secondary | ICD-10-CM | POA: Insufficient documentation

## 2017-02-16 DIAGNOSIS — F17200 Nicotine dependence, unspecified, uncomplicated: Secondary | ICD-10-CM | POA: Diagnosis not present

## 2017-02-16 DIAGNOSIS — L249 Irritant contact dermatitis, unspecified cause: Secondary | ICD-10-CM | POA: Diagnosis not present

## 2017-02-16 DIAGNOSIS — Z79899 Other long term (current) drug therapy: Secondary | ICD-10-CM | POA: Insufficient documentation

## 2017-02-16 LAB — POCT URINALYSIS DIPSTICK
BILIRUBIN UA: NEGATIVE
GLUCOSE UA: NEGATIVE
KETONES UA: NEGATIVE
LEUKOCYTES UA: NEGATIVE
Nitrite, UA: NEGATIVE
Protein, UA: NEGATIVE
Spec Grav, UA: 1.025 (ref 1.010–1.025)
Urobilinogen, UA: 1 E.U./dL
pH, UA: 6 (ref 5.0–8.0)

## 2017-02-16 MED ORDER — HYDROXYZINE HCL 50 MG PO TABS
50.0000 mg | ORAL_TABLET | Freq: Three times a day (TID) | ORAL | 0 refills | Status: AC | PRN
Start: 2017-02-16 — End: ?

## 2017-02-16 MED ORDER — TOPIRAMATE ER 200 MG PO SPRINKLE CAP24: 200.0000 mg | EXTENDED_RELEASE_CAPSULE | Freq: Every day | ORAL | 11 refills | Status: AC

## 2017-02-16 MED ORDER — BUPROPION HCL ER (SR) 150 MG PO TB12: 150.0000 mg | ORAL_TABLET | Freq: Two times a day (BID) | ORAL | 2 refills | Status: AC

## 2017-02-16 MED ORDER — TRIAMCINOLONE ACETONIDE 0.025 % EX OINT: 1.0000 "application " | TOPICAL_OINTMENT | Freq: Two times a day (BID) | CUTANEOUS | 0 refills | Status: AC

## 2017-02-16 NOTE — Progress Notes (Signed)
Patient is here for rash on arm   Patient complains about lower back abdominal pain

## 2017-02-16 NOTE — Progress Notes (Signed)
Subjective:  Patient ID: Mary Mclaughlin, female    DOB: 02/03/1966  Age: 51 y.o. MRN: [id number]  CC: Establish Care   HPI Mary Mclaughlin presents for patient complains of syncope. Onset was 1 month ago. She reports visiting her daughter in the hospital on Father's Day when she had an episode of syncope. It was recommended that she be evaluated in the hospital, after she refused. She reports feeling"off balance"since hospital visit. She denies any other episodes of syncope since. Patient also has associated symptoms of  fatigue. She does report history of seizure disorder. She reports being without her topiramate for 2 weeks.. Taking culprit meds?: diuretics for antihypertensive medications. she does report upcoming  appointment with neurologist later this month for [facility]. She also reports right upper abdominal pain. Associated symptoms include: loose stools with greater than bile-like appearance. Workup in the past has included: abdominal ultrasound. Patient complains of rash involving the bilateral arm. Rash started 2 weeks ago. Appearance of rash at onset: Texture of lesion(s): raised. Rash has not changed over time  Discomfort associated with rash: is pruritic.  Associated symptoms: none. Patient has not had previous evaluation of rash. Patient has not had previous treatment. Patient has not had contacts with similar rash. Patient reports symptoms are worse with exposure to sunlight. Patient has not had new exposures (soaps, lotions, laundry detergents, foods, medications, plants, insects or animals.). She reports using over-the-counter hydrocortisone cream and benadryl with minimal relief of symptoms.   Outpatient Medications Prior to Visit  Medication Sig Dispense Refill  . mometasone-formoterol (DULERA) 100-5 MCG/ACT AERO Inhale 2 puffs into the lungs 2 (two) times daily. 13 g 2  . albuterol (PROAIR HFA) 108 (90 Base) MCG/ACT inhaler Inhale 1-2 puffs into the lungs every 6  (six) hours as needed for wheezing or shortness of breath. 1 Inhaler 3  . nicotine (NICOTROL) 10 MG inhaler Inhale 1 Cartridge (1 continuous puffing total) into the lungs as needed for smoking cessation. 42 each 0  . Topiramate ER (QUDEXY XR) 200 MG CS24 Take 200 mg by mouth at bedtime. 30 each 11  . varenicline (CHANTIX STARTING MONTH PAK) 0.5 MG X 11 & 1 MG X 42 tablet Take one 0.5 mg tablet by mouth 1 x  daily for 3 days, then increase to one 0.5 mg tablet BID  for 4 days, then increase to one 1 mg tablet BID. 53 tablet 0   No facility-administered medications prior to visit.     ROS Review of Systems  Constitutional: Negative.   HENT: Negative.   Respiratory: Negative.   Cardiovascular: Negative.   Gastrointestinal: Positive for abdominal pain.  Skin: Positive for rash.  Neurological: Negative.   Psychiatric/Behavioral: Negative.     Objective:  BP 106/71 (BP Location: Left Arm, Patient Position: Sitting, Cuff Size: Normal)   Pulse 65   Temp 98.2 F (36.8 C) (Oral)   Resp 18   Ht '5\' 7"'$  (1.702 m)   Wt 161 lb 6.4 oz (73.2 kg)   SpO2 99%   BMI 25.28 kg/m   BP/Weight 02/16/2017 02/14/2017 4/00/8676  Systolic BP 195 093 267  Diastolic BP 71 74 78  Wt. (Lbs) 161.4 160 168  BMI 25.28 25.06 26.31     Physical Exam  Constitutional: She is oriented to person, place, and time. She appears well-developed and well-nourished.  HENT:  Head: Normocephalic and atraumatic.  Right Ear: External ear normal.  Left Ear: External ear normal.  Nose: Nose normal.  Mouth/Throat:  Oropharynx is clear and moist.  Eyes: Pupils are equal, round, and reactive to light. Conjunctivae and EOM are normal.  Neck: Normal range of motion. Neck supple. No JVD present.  Cardiovascular: Normal rate, regular rhythm, normal heart sounds and intact distal pulses.   Pulmonary/Chest: Effort normal and breath sounds normal.  Abdominal: Soft. Bowel sounds are normal. There is tenderness (RUQ/RLQ).    Neurological: She is alert and oriented to person, place, and time.  Skin: Skin is warm and dry. Rash noted. Rash is papular (bilateral forearms; no redness present).  Psychiatric: She has a normal mood and affect.  Nursing note and vitals reviewed.  Assessment & Plan:   Problem List Items Addressed This Visit    None    Visit Diagnoses    History of syncope    -  Primary   Workup can exclude cardiac cause related to syncope    Encouraged to follow-up with neurological appointment.    Relevant Orders   CMP14+EGFR (Completed)   CBC with Differential (Completed)   EKG 12-Lead   Ready to quit smoking       Relevant Medications   buPROPion (WELLBUTRIN SR) 150 MG 12 hr tablet   Irritant contact dermatitis, unspecified trigger       Encourage use of sunscreen    Relevant Medications   triamcinolone (KENALOG) 0.025 % ointment   hydrOXYzine (ATARAX/VISTARIL) 50 MG tablet   RUQ abdominal pain       Relevant Orders   CMP14+EGFR (Completed)   Lipid Panel (Completed)   CBC with Differential (Completed)   Urinalysis Dipstick (Completed)   History of seizure disorder       Relevant Medications   Topiramate ER (QUDEXY XR) 200 MG CS24      Meds ordered this encounter  Medications  . Topiramate ER (QUDEXY XR) 200 MG CS24    Sig: Take 200 mg by mouth at bedtime.    Dispense:  30 each    Refill:  11    Order Specific Question:   Supervising Provider    Answer:   Tresa Garter [id number]  . buPROPion (WELLBUTRIN SR) 150 MG 12 hr tablet    Sig: Take 1 tablet (150 mg total) by mouth 2 (two) times daily.    Dispense:  60 tablet    Refill:  2    Order Specific Question:   Supervising Provider    Answer:   Tresa Garter [id number]  . triamcinolone (KENALOG) 0.025 % ointment    Sig: Apply 1 application topically 2 (two) times daily.    Dispense:  30 g    Refill:  0    Order Specific Question:   Supervising Provider    Answer:   Tresa Garter [id number]  .  hydrOXYzine (ATARAX/VISTARIL) 50 MG tablet    Sig: Take 1 tablet (50 mg total) by mouth every 8 (eight) hours as needed for itching.    Dispense:  30 tablet    Refill:  0    Order Specific Question:   Supervising Provider    Answer:   Tresa Garter [id number]    Follow-up: Return in 8 weeks (on 04/13/2017), or if symptoms worsen or fail to improve, for PAP .   Alfonse Spruce FNP

## 2017-02-16 NOTE — Patient Instructions (Addendum)
Please follow up with upcoming neurology appointment.   Syncope Syncope is when you lose temporarily pass out (faint). Signs that you may be about to pass out include:  Feeling dizzy or light-headed.  Feeling sick to your stomach (nauseous).  Seeing all white or all black.  Having cold, clammy skin.  If you passed out, get help right away. Call your local emergency services (911 in the [place] Do not drive yourself to the hospital. Follow these instructions at home: Pay attention to any changes in your symptoms. Take these actions to help with your condition:  Have someone stay with you until you feel stable.  Do not drive, use machinery, or play sports until your doctor says it is okay.  Keep all follow-up visits as told by your doctor. This is important.  If you start to feel like you might pass out, lie down right away and raise (elevate) your feet above the level of your heart. Breathe deeply and steadily. Wait until all of the symptoms are gone.  Drink enough fluid to keep your pee (urine) clear or pale yellow.  If you are taking blood pressure or heart medicine, get up slowly and spend many minutes getting ready to sit and then stand. This can help with dizziness.  Take over-the-counter and prescription medicines only as told by your doctor.  Get help right away if:  You have a very bad headache.  You have unusual pain in your chest, tummy, or back.  You are bleeding from your mouth or rectum.  You have black or tarry poop (stool).  You have a very fast or uneven heartbeat (palpitations).  It hurts to breathe.  You pass out once or more than once.  You have jerky movements that you cannot control (seizure).  You are confused.  You have trouble walking.  You are very weak.  You have vision problems. These symptoms may be an emergency. Do not wait to see if the symptoms will go away. Get medical help right away. Call your local emergency services (911 in  the [place] Do not drive yourself to the hospital. This information is not intended to replace advice given to you by your health care provider. Make sure you discuss any questions you have with your health care provider. Document Released: 01/05/2008 Document Revised: 12/25/2015 Document Reviewed: 04/02/2015 Elsevier Interactive Patient Education  2018 [employer]   Contact Dermatitis Dermatitis is redness, soreness, and swelling (inflammation) of the skin. Contact dermatitis is a reaction to certain substances that touch the skin. You either touched something that irritated your skin, or you have allergies to something you touched. Follow these instructions at home: Skin Care Moisturize your skin as needed. Apply cool compresses to the affected areas. Try taking a bath with: Epsom salts. Follow the instructions on the package. You can get these at a pharmacy or grocery store. Baking soda. Pour a small amount into the bath as told by your doctor. Colloidal oatmeal. Follow the instructions on the package. You can get this at a pharmacy or grocery store. Try applying baking soda paste to your skin. Stir water into baking soda until it looks like paste. Do not scratch your skin. Bathe less often. Bathe in lukewarm water. Avoid using hot water. Medicines Take or apply over-the-counter and prescription medicines only as told by your doctor. If you were prescribed an antibiotic medicine, take or apply your antibiotic as told by your doctor. Do not stop taking the antibiotic even if your condition  starts to get better. General instructions Keep all follow-up visits as told by your doctor. This is important. Avoid the substance that caused your reaction. If you do not know what caused it, keep a journal to try to track what caused it. Write down: What you eat. What cosmetic products you use. What you drink. What you wear in the affected area. This includes jewelry. If you were given a  bandage (dressing), take care of it as told by your doctor. This includes when to change and remove it. Contact a doctor if: You do not get better with treatment. Your condition gets worse. You have signs of infection such as: Swelling. Tenderness. Redness. Soreness. Warmth. You have a fever. You have new symptoms. Get help right away if: You have a very bad headache. You have neck pain. Your neck is stiff. You throw up (vomit). You feel very sleepy. You see red streaks coming from the affected area. Your bone or joint underneath the affected area becomes painful after the skin has healed. The affected area turns darker. You have trouble breathing. This information is not intended to replace advice given to you by your health care provider. Make sure you discuss any questions you have with your health care provider. Document Released: 05/16/2009 Document Revised: 12/25/2015 Document Reviewed: 12/04/2014 Elsevier Interactive Patient Education  2018 [employer].

## 2017-02-17 LAB — CMP14+EGFR
ALBUMIN: 4.5 g/dL (ref 3.5–5.5)
ALK PHOS: 73 IU/L (ref 39–117)
ALT: 10 IU/L (ref 0–32)
AST: 21 IU/L (ref 0–40)
Albumin/Globulin Ratio: 1.7 (ref 1.2–2.2)
BILIRUBIN TOTAL: 0.9 mg/dL (ref 0.0–1.2)
BUN / CREAT RATIO: 8 — AB (ref 9–23)
BUN: 7 mg/dL (ref 6–24)
CHLORIDE: 100 mmol/L (ref 96–106)
CO2: 24 mmol/L (ref 20–29)
Calcium: 9.4 mg/dL (ref 8.7–10.2)
Creatinine, Ser: 0.83 mg/dL (ref 0.57–1.00)
GFR calc Af Amer: 96 mL/min/{1.73_m2} (ref >59)
GFR calc non Af Amer: 84 mL/min/{1.73_m2} (ref >59)
GLUCOSE: 86 mg/dL (ref 65–99)
Globulin, Total: 2.7 g/dL (ref 1.5–4.5)
Potassium: 4 mmol/L (ref 3.5–5.2)
Sodium: 140 mmol/L (ref 134–144)
Total Protein: 7.2 g/dL (ref 6.0–8.5)

## 2017-02-17 LAB — LIPID PANEL
CHOLESTEROL TOTAL: 155 mg/dL (ref 100–199)
Chol/HDL Ratio: 2.9 ratio (ref 0.0–4.4)
HDL: 54 mg/dL (ref >39)
LDL Calculated: 86 mg/dL (ref 0–99)
TRIGLYCERIDES: 75 mg/dL (ref 0–149)
VLDL CHOLESTEROL CAL: 15 mg/dL (ref 5–40)

## 2017-02-17 LAB — CBC WITH DIFFERENTIAL/PLATELET
BASOS ABS: 0 10*3/uL (ref 0.0–0.2)
BASOS: 1 %
EOS (ABSOLUTE): 0.1 10*3/uL (ref 0.0–0.4)
Eos: 2 %
Hematocrit: 38.7 % (ref 34.0–46.6)
Hemoglobin: 12.7 g/dL (ref 11.1–15.9)
Immature Grans (Abs): 0 10*3/uL (ref 0.0–0.1)
Immature Granulocytes: 0 %
LYMPHS ABS: 1.6 10*3/uL (ref 0.7–3.1)
LYMPHS: 29 %
MCH: 26.7 pg (ref 26.6–33.0)
MCHC: 32.8 g/dL (ref 31.5–35.7)
MCV: 82 fL (ref 79–97)
Monocytes Absolute: 0.4 10*3/uL (ref 0.1–0.9)
Monocytes: 8 %
NEUTROS PCT: 60 %
Neutrophils Absolute: 3.4 10*3/uL (ref 1.4–7.0)
PLATELETS: 301 10*3/uL (ref 150–379)
RBC: 4.75 x10E6/uL (ref 3.77–5.28)
RDW: 15.5 % — AB (ref 12.3–15.4)
WBC: 5.6 10*3/uL (ref 3.4–10.8)

## 2017-02-22 ENCOUNTER — Other Ambulatory Visit: Payer: Self-pay | Admitting: Family Medicine

## 2017-02-24 ENCOUNTER — Telehealth: Payer: Self-pay

## 2017-02-24 NOTE — Telephone Encounter (Signed)
CMA call regarding lab results   Patient verify DOB  Patient was aware and understood  

## 2017-02-24 NOTE — Telephone Encounter (Signed)
-----   Message from Lizbeth BarkMandesia R Hairston, [place]NP sent at 02/22/2017  2:07 PM EDT ----- Labs that evaluated your blood cells, fluid and electrolyte balance are normal. No signs of anemia, infection, or inflammation present. Liver function normal Kidney function normal Cholesterol levels are normal.

## 2017-03-01 ENCOUNTER — Ambulatory Visit: Payer: Medicaid Other | Admitting: Family Medicine

## 2017-03-01 ENCOUNTER — Encounter: Payer: Self-pay | Admitting: Neurology

## 2017-03-01 ENCOUNTER — Ambulatory Visit (INDEPENDENT_AMBULATORY_CARE_PROVIDER_SITE_OTHER): Payer: Medicaid Other | Admitting: Neurology

## 2017-03-01 VITALS — BP 102/69 | HR 72 | Ht 67.0 in | Wt 163.0 lb

## 2017-03-01 DIAGNOSIS — R51 Headache: Secondary | ICD-10-CM

## 2017-03-01 DIAGNOSIS — G8929 Other chronic pain: Secondary | ICD-10-CM

## 2017-03-01 DIAGNOSIS — R29898 Other symptoms and signs involving the musculoskeletal system: Secondary | ICD-10-CM

## 2017-03-01 DIAGNOSIS — R202 Paresthesia of skin: Secondary | ICD-10-CM

## 2017-03-01 DIAGNOSIS — G40909 Epilepsy, unspecified, not intractable, without status epilepticus: Secondary | ICD-10-CM

## 2017-03-01 MED ORDER — DIVALPROEX SODIUM ER 500 MG PO TB24: 1000.0000 mg | ORAL_TABLET | Freq: Every day | ORAL | 6 refills | Status: AC

## 2017-03-01 NOTE — Progress Notes (Signed)
[facility]    Provider:  Dr Lucia GaskinsAhern Referring Provider: Thomas HoffHairston, Mandesia R, F* Primary Care Physician:  Lizbeth BarkHairston, Mandesia R, FNP  CC:  Seizures  Interval history: Patient is still noncompliant with medications despite changing Topiramate to once daily dosing.  Topiramate level was 0 at last appointment. She had acute onset left hand and face numbness. Lasted a few hours off and on and resolved. She was in bed. Just happened a week ago.  She has epilepsy and never an MRi brain that she can remember.   HPI:  Mary Mclaughlin is a 51 y.o. female here as a referral from Dr. Jenelle MagesHairston for seizures and migraines. Past medical history of COPD following with pulmonology and is a current smoker half a pack a day for over 20 years, depression. She has a history of a seizure disorder. She is on Topiramate and not taking medication regularly. She was diagnosed with seizures in 2012, her body went limp and she had shaking. She had an eeg, she used to [facility] a neurolog[facility] every [facility] [place], she had EEGs and MRI of the brain at the time she had a history of alcohol and drug abuse. She has not seen a neurologist since 2016 but did regularly see a neurologist from 2012-2016. Her migraines are on the front of the head and the left side. She gets migraines 3-4 times a week. She gets dizzy when she bends over and stands up. Sleeping helps, she has light sesnitivity and can't move, darkness helps. Unknown triggers, she is always tired. She goes to bed at 9pm and sleeps well. The last time she had a seizure was in March. Before that in February. The migraines and seizures are well controlled if she takes the medication but she forgets to take it twice a day. She has daily headaches, over 15 are migrainous, no aura, she has nausea and vomiting and light and sound sensitivity. They can last all day unless she sleeps or tries to take something. No medication overuse. No other focal  neurologic deficits, associated symptoms, inciting events or modifiable factors.  Meds tried: Topiramate, Celexa   Reviewed notes, labs and imaging from outside physicians, which showed:  CBC and CMP unremarkable.   Review primary care notes. She has a history of seizure disorder. Last seizure was March 2018. Reports adherence with medication for seizures. Denies any vision changes or difficulty keeping her balance. She recently moved and she was followed by a neurologist previously. Reports starting Topiramate in 2012. She sees psychiatry for depression.She is a current smoker. Reported Seizure Feb 17th and Mar of this year.   Review of Systems: Patient complains of symptoms per HPI as well as the following symptoms: Activity change, appetite change, fatigue, abdominal pain, dizziness, headache, numbness, passing out, neck pain No chest pain, no history of cardiac problems or strokes. Pertinent negatives per HPI. All others negative.  Social History   Social History  . Marital status: Significant Other    Spouse name: N/A  . Number of children: 3  . Years of education: N/A   Occupational History  . N/A    Social History Main Topics  . Smoking status: Current Every Day Smoker    Packs/day: 0.50    Years: 20.00    Types: Cigarettes  . Smokeless tobacco: Never Used  . Alcohol use 1.2 oz/week    2 Cans of beer per week     Comment: drinks beer once a week  .  Drug use: No     Comment: quit 10/2015  . Sexual activity: Yes   Other Topics Concern  . Not on file   Social History Narrative   Drinks 1 cup of coffee a day, occasional [employer]    Single   3 grown children   Unemployed   Lives with daughter   [place]    Family History  Problem Relation Age of Onset  . Hypertension Mother   . Cancer Father   . Emphysema Father   . Seizures Neg Hx     Past Medical History:  Diagnosis Date  . COPD (chronic obstructive pulmonary disease) (HCC)   .  Headache   . Seizures (HCC)     Past Surgical History:  Procedure Laterality Date  . LEEP    . TUBAL LIGATION      Current Outpatient Prescriptions  Medication Sig Dispense Refill  . albuterol (PROAIR HFA) 108 (90 Base) MCG/ACT inhaler Inhale 1-2 puffs into the lungs every 6 (six) hours as needed for wheezing or shortness of breath. 1 Inhaler 3  . buPROPion (WELLBUTRIN SR) 150 MG 12 hr tablet Take 1 tablet (150 mg total) by mouth 2 (two) times daily. 60 tablet 2  . hydrOXYzine (ATARAX/VISTARIL) 50 MG tablet Take 1 tablet (50 mg total) by mouth every 8 (eight) hours as needed for itching. 30 tablet 0  . mometasone-formoterol (DULERA) 100-5 MCG/ACT AERO Inhale 2 puffs into the lungs 2 (two) times daily. 13 g 2  . triamcinolone (KENALOG) 0.025 % ointment Apply 1 application topically 2 (two) times daily. 30 g 0  . divalproex (DEPAKOTE ER) 500 MG 24 hr tablet Take 2 tablets (1,000 mg total) by mouth daily. 60 tablet 6  . Topiramate ER (QUDEXY XR) 200 MG CS24 Take 200 mg by mouth at bedtime. (Patient not taking: Reported on 03/01/2017) 30 each 11   No current facility-administered medications for this visit.     Allergies as of 03/01/2017  . (No Known Allergies)    Vitals: BP 102/69   Pulse 72   Ht 5\' 7"  (1.702 m)   Wt 163 lb (73.9 kg)   BMI 25.53 kg/m  Last Weight:  Wt Readings from Last 1 Encounters:  03/01/17 163 lb (73.9 kg)   Last Height:   Ht Readings from Last 1 Encounters:  03/01/17 5\' 7"  (1.702 m)   Physical exam: Exam: Gen: NAD, conversant, well nourised, obese, well groomed                      Neuro: Detailed Neurologic Exam  Speech:    Speech is normal; fluent and spontaneous with normal comprehension.  Cognition:    The patient is oriented to person, place, and time;  Cranial Nerves:    The pupils are equal, round, and reactive to light. Visual fields are full to finger confrontation. Extraocular movements are intact. Trigeminal sensation is intact and  the muscles of mastication are normal. The palate elevates in the midline. Hearing intact. Voice is normal. Shoulder shrug is normal. The tongue has normal motion without fasciculations.   Coordination:    Normal finger to nose and heel to shin. Normal rapid alternating movements.   Gait:    Heel-toe and tandem gait are normal.   Motor Observation:    No asymmetry, no atrophy, and no involuntary movements noted. Tone:    Normal muscle tone.    Posture:    Posture is normal. normal  erect    Strength:    Strength is V/V in the upper and lower limbs.       Assessment/Plan:  This is a very nice 51 year old patient with a history of seizures, migraines who is here for follow-up of both. She is on topiramate 100 mg twice a day there is medication noncompliance leading to breakthrough seizures and breakthrough migraines. She reports when she takes the medication she is well-controlled. Change to once a day extended release topiramate and see if this will help with compliance which it did not.   MRI brain w.wo contrast for episodes of syncope and loss of awareness, hx of seizures If negative, recommend daily aspirin for stroke prevention Start Depakote for migraines and seizures For one week take one pill at bedtime then increase to two pills at bedtime. Needs followup in 8 weeks for depakote levels and cbc/cmp.  She is still smoking, discussed stroke risks  Discussed compliance with medication.  Patient is unable to drive, operate heavy machinery, perform activities at heights or participate in water activities until 6 months seizure free  Discussed Patients with epilepsy have a small risk of sudden unexpected death, a condition referred to as sudden unexpected death in epilepsy (SUDEP). SUDEP is defined specifically as the sudden, unexpected, witnessed or unwitnessed, nontraumatic and nondrowning death in patients with epilepsy with or without evidence for a seizure, and excluding  documented status epilepticus, in which post mortem examination does not reveal a structural or toxicologic cause for death    Orders Placed This Encounter  Procedures  . MR BRAIN W WO CONTRAST     Naomie Dean, MD  [facility] [address] [place], [place] [address]  Phone [phone] Fax [phone]  A total of 30 minutes was spent face-to-face with this patient. Over half this time was spent on counseling patient on the migraine, seizures diagnosis and different diagnostic and therapeutic options available.

## 2017-03-01 NOTE — Patient Instructions (Signed)
For one week take one pill at bedtime then increase to two pills at bedtime. Needs followup in 8 weeks for depakote levels and cbc/cmp.    Valproic Acid, Divalproex Sodium delayed or extended-release tablets What is this medicine? DIVALPROEX SODIUM (dye VAL pro ex SO dee um) is used to prevent seizures caused by some forms of epilepsy. It is also used to treat bipolar mania and to prevent migraine headaches. This medicine may be used for other purposes; ask your health care provider or pharmacist if you have questions. COMMON BRAND NAME(S): Depakote, Depakote ER What should I tell my health care provider before I take this medicine? They need to know if you have any of these conditions: -if you often drink alcohol -kidney disease -liver disease -low platelet counts -mitochondrial disease -suicidal thoughts, plans, or attempt; a previous suicide attempt by you or a family member -urea cycle disorder (UCD) -an unusual or allergic reaction to divalproex sodium, sodium valproate, valproic acid, other medicines, foods, dyes, or preservatives -pregnant or trying to get pregnant -breast-feeding How should I use this medicine? Take this medicine by mouth with a drink of water. Follow the directions on the prescription label. Do not cut, crush or chew this medicine. You can take it with or without food. If it upsets your stomach, take it with food. Take your medicine at regular intervals. Do not take it more often than directed. Do not stop taking except on your doctor's advice. A special MedGuide will be given to you by the pharmacist with each prescription and refill. Be sure to read this information carefully each time. Talk to your pediatrician regarding the use of this medicine in children. While this drug may be prescribed for children as young as 10 years for selected conditions, precautions do apply. Overdosage: If you think you have taken too much of this medicine contact a poison control  center or emergency room at once. NOTE: This medicine is only for you. Do not share this medicine with others. What if I miss a dose? If you miss a dose, take it as soon as you can. If it is almost time for your next dose, take only that dose. Do not take double or extra doses. What may interact with this medicine? Do not take this medicine with any of the following medications: -sodium phenylbutyrate This medicine may also interact with the following medications: -aspirin -certain antibiotics like ertapenem, imipenem, meropenem -certain medicines for depression, anxiety, or psychotic disturbances -certain medicines for seizures like carbamazepine, clonazepam, diazepam, ethosuximide, felbamate, lamotrigine, phenobarbital, phenytoin, primidone, rufinamide, topiramate -certain medicines that treat or prevent blood clots like warfarin -cholestyramine -female hormones, like estrogens and birth control pills, patches, or rings -propofol -rifampin -ritonavir -tolbutamide -zidovudine This list may not describe all possible interactions. Give your health care provider a list of all the medicines, herbs, non-prescription drugs, or dietary supplements you use. Also tell them if you smoke, drink alcohol, or use illegal drugs. Some items may interact with your medicine. What should I watch for while using this medicine? Tell your doctor or healthcare professional if your symptoms do not get better or they start to get worse. Wear a medical ID bracelet or chain, and carry a card that describes your disease and details of your medicine and dosage times. You may get drowsy, dizzy, or have blurred vision. Do not drive, use machinery, or do anything that needs mental alertness until you know how this medicine affects you. To reduce dizzy or fainting spells,  do not sit or stand up quickly, especially if you are an older patient. Alcohol can increase drowsiness and dizziness. Avoid alcoholic drinks. This  medicine can make you more sensitive to the sun. Keep out of the sun. If you cannot avoid being in the sun, wear protective clothing and use sunscreen. Do not use sun lamps or tanning beds/booths. Patients and their families should watch out for new or worsening depression or thoughts of suicide. Also watch out for sudden changes in feelings such as feeling anxious, agitated, panicky, irritable, hostile, aggressive, impulsive, severely restless, overly excited and hyperactive, or not being able to sleep. If this happens, especially at the beginning of treatment or after a change in dose, call your health care professional. Women should inform their doctor if they wish to become pregnant or think they might be pregnant. There is a potential for serious side effects to an unborn child. Talk to your health care professional or pharmacist for more information. Women who become pregnant while using this medicine may enroll in the [place] American Antiepileptic Drug Pregnancy Registry by calling [phone]. This registry collects information about the safety of antiepileptic drug use during pregnancy. What side effects may I notice from receiving this medicine? Side effects that you should report to your doctor or health care professional as soon as possible: -allergic reactions like skin rash, itching or hives, swelling of the face, lips, or tongue -changes in vision -redness, blistering, peeling or loosening of the skin, including inside the mouth -signs and symptoms of liver injury like dark yellow or brown urine; general ill feeling or flu-like symptoms; light-colored stools; loss of appetite; nausea; right upper belly pain; unusually weak or tired; yellowing of the eyes or skin -suicidal thoughts or other mood changes -unusual bleeding or bruising Side effects that usually do not require medical attention (report to your doctor or health care professional if they continue or are  bothersome): -constipation -diarrhea -dizziness -hair loss -headache -loss of appetite -weight gain This list may not describe all possible side effects. Call your doctor for medical advice about side effects. You may report side effects to FDA at 1-800-FDA-1088. Where should I keep my medicine? Keep out of reach of children. Store at room temperature between 15 and 30 degrees C (59 and 86 degrees F). Keep container tightly closed. Throw away any unused medicine after the expiration date. NOTE: This sheet is a summary. It may not cover all possible information. If you have questions about this medicine, talk to your doctor, pharmacist, or health care provider.  2018 Elsevier/Gold Standard (2015-10-23 07:11:40)

## 2017-03-15 ENCOUNTER — Other Ambulatory Visit: Payer: Medicaid Other

## 2017-03-21 ENCOUNTER — Telehealth: Payer: Self-pay | Admitting: Neurology

## 2017-03-21 ENCOUNTER — Telehealth: Payer: Self-pay | Admitting: Internal Medicine

## 2017-03-21 DIAGNOSIS — G43611 Persistent migraine aura with cerebral infarction, intractable, with status migrainosus: Secondary | ICD-10-CM

## 2017-03-21 DIAGNOSIS — I639 Cerebral infarction, unspecified: Principal | ICD-10-CM

## 2017-03-21 DIAGNOSIS — R0609 Other forms of dyspnea: Secondary | ICD-10-CM

## 2017-03-21 DIAGNOSIS — J410 Simple chronic bronchitis: Secondary | ICD-10-CM

## 2017-03-21 NOTE — Telephone Encounter (Signed)
[email or web address] - Dr Marchelle Gearing in [place], [place] but she needs to a) see if this is too far for her and b) if he will accept her insurance  Dr. Kalman Shan, M.D., [facility].C.P Pulmonary and Critical Care Medicine Staff Physician [facility] Pager: [phone], If no answer or between  15:00h - 7:00h: call [phone]  03/21/2017 5:34 PM

## 2017-03-21 NOTE — Telephone Encounter (Signed)
Patient is moving to [place] on 03-27-17 and would like to be referred to a neurologist there.

## 2017-03-21 NOTE — Telephone Encounter (Signed)
I will make a referral to a neurologist in [place].

## 2017-03-21 NOTE — Telephone Encounter (Signed)
Spoke with pt. She is moving to [place] at the end of this month. Pt is wanting MR to refer and recommend someone in that area.  MR - please advise. Thanks.

## 2017-03-21 NOTE — Addendum Note (Signed)
Addended by: York Spaniel on: 03/21/2017 02:29 PM   Modules accepted: Orders

## 2017-03-22 NOTE — Telephone Encounter (Signed)
Spoke with pt. She is aware of MR's response. Pt asks that we make a referral. This has been done. Nothing further was needed.

## 2017-03-25 ENCOUNTER — Telehealth: Payer: Self-pay | Admitting: Family Medicine

## 2017-03-25 NOTE — Telephone Encounter (Signed)
Pt called to find out if you can recommend a PCP that will take Medicaid in [place] since she is moving there, please caller her back,

## 2017-03-30 ENCOUNTER — Ambulatory Visit: Payer: Medicaid Other | Admitting: Neurology

## 2017-04-07 NOTE — Telephone Encounter (Signed)
Pt is asking for a call back with the name of the neurologist in [place] that Dr Anne HahnWillis is referring her to

## 2017-04-07 NOTE — Telephone Encounter (Signed)
Called pt. Advised Dr Anne HahnWillis referred her to [facility] [place]. [phone]. She verbalized understanding and appreciation for call. Nothing further needed.

## 2017-04-07 NOTE — Telephone Encounter (Signed)
Pt has questions about the referral and is asking for a call back please

## 2017-04-08 NOTE — Telephone Encounter (Signed)
Tried to call patient x2 and her phone was not in service . I have faxed referral times two with my ok conformation.

## 2017-04-20 ENCOUNTER — Other Ambulatory Visit: Payer: Medicaid Other | Admitting: Family Medicine

## 2017-04-25 NOTE — Progress Notes (Deleted)
[facility]  PATIENT: Mary Mclaughlin DOB: July 08, 1966   REASON FOR VISIT: *** HISTORY FROM:    HISTORY OF PRESENT ILLNESS:Interval history: Patient is still noncompliant with medications despite changing Topiramate to once daily dosing.  Topiramate level was 0 at last appointment. She had acute onset left hand and face numbness. Lasted a few hours off and on and resolved. She was in bed. Just happened a week ago.  She has epilepsy and never an MRi brain that she can remember.   ZOX:WRUE Mary Mclaughlin a 50 y.o.femalehere as a referral from Dr. Suan Halter seizures and migraines. Past medical history of COPD following with pulmonology and is a current smoker half a pack a day for over 20 years, depression. She has a history of a seizure disorder. She is on Topiramate and not taking medication regularly. She was diagnosed with seizures in 2012, her body went limp and she had shaking. She had an eeg, she used to see a neurologist every month at [facility] in [place], she had EEGs and MRI of the brain at the time she had a history of alcohol and drug abuse. She has not seen a neurologist since 2016 but did regularly see a neurologist from 2012-2016. Her migraines are on the front of the head and the left side. She gets migraines 3-4 times a week. She gets dizzy when she bends over and stands up. Sleeping helps, she has light sesnitivity and can't move, darkness helps. Unknown triggers, she is always tired. She goes to bed at 9pm and sleeps well. The last time she had a seizure was in March. Before that in February. The migraines and seizures are well controlled if she takes the medication but she forgets to take it twice a day. She has daily headaches, over 15 are migrainous, no aura, she has nausea and vomiting and light and sound sensitivity. They can last all day unless she sleeps or tries to take something. No medication overuse. No other focal neurologic deficits, associated  symptoms, inciting events or modifiable factors.   REVIEW OF SYSTEMS: Full 14 system review of systems performed and notable only for those listed, all others are neg:  Constitutional: neg  Cardiovascular: neg Ear/Nose/Throat: neg  Skin: neg Eyes: neg Respiratory: neg Gastroitestinal: neg  Hematology/Lymphatic: neg  Endocrine: neg Musculoskeletal:neg Allergy/Immunology: neg Neurological: neg Psychiatric: neg Sleep : neg   ALLERGIES: No Known Allergies  HOME MEDICATIONS: Outpatient Medications Prior to Visit  Medication Sig Dispense Refill  . albuterol (PROAIR HFA) 108 (90 Base) MCG/ACT inhaler Inhale 1-2 puffs into the lungs every 6 (six) hours as needed for wheezing or shortness of breath. 1 Inhaler 3  . buPROPion (WELLBUTRIN SR) 150 MG 12 hr tablet Take 1 tablet (150 mg total) by mouth 2 (two) times daily. 60 tablet 2  . divalproex (DEPAKOTE ER) 500 MG 24 hr tablet Take 2 tablets (1,000 mg total) by mouth daily. 60 tablet 6  . hydrOXYzine (ATARAX/VISTARIL) 50 MG tablet Take 1 tablet (50 mg total) by mouth every 8 (eight) hours as needed for itching. 30 tablet 0  . mometasone-formoterol (DULERA) 100-5 MCG/ACT AERO Inhale 2 puffs into the lungs 2 (two) times daily. 13 g 2  . Topiramate ER (QUDEXY XR) 200 MG CS24 Take 200 mg by mouth at bedtime. (Patient not taking: Reported on 03/01/2017) 30 each 11  . triamcinolone (KENALOG) 0.025 % ointment Apply 1 application topically 2 (two) times daily. 30 g 0   No facility-administered medications prior to visit.  PAST MEDICAL HISTORY: Past Medical History:  Diagnosis Date  . COPD (chronic obstructive pulmonary disease) (HCC)   . Headache   . Seizures (HCC)     PAST SURGICAL HISTORY: Past Surgical History:  Procedure Laterality Date  . LEEP    . TUBAL LIGATION      FAMILY HISTORY: Family History  Problem Relation Age of Onset  . Hypertension Mother   . Cancer Father   . Emphysema Father   . Seizures Neg Hx      SOCIAL HISTORY: Social History   Social History  . Marital status: Significant Other    Spouse name: N/A  . Number of children: 3  . Years of education: N/A   Occupational History  . N/A    Social History Main Topics  . Smoking status: Current Every Day Smoker    Packs/day: 0.50    Years: 20.00    Types: Cigarettes  . Smokeless tobacco: Never Used  . Alcohol use 1.2 oz/week    2 Cans of beer per week     Comment: drinks beer once a week  . Drug use: No     Comment: quit 10/2015  . Sexual activity: Yes   Other Topics Concern  . Not on file   Social History Narrative   Drinks 1 cup of coffee a day, occasional [employer]    Single   3 grown children   Unemployed   Lives with daughter   Linton Ham here from [place] in 07/2016     PHYSICAL EXAM  There were no vitals filed for this visit. There is no height or weight on file to calculate BMI.  Generalized: Well developed, in no acute distress  Head: normocephalic and atraumatic,. Oropharynx benign  Neck: Supple, no carotid bruits  Cardiac: Regular rate rhythm, no murmur  Musculoskeletal: No deformity   Neurological examination   Mentation: Alert oriented to time, place, history taking. Attention span and concentration appropriate. Recent and remote memory intact.  Follows all commands speech and language fluent.   Cranial nerve II-XII: Fundoscopic exam reveals sharp disc margins.Pupils were equal round reactive to light extraocular movements were full, visual field were full on confrontational test. Facial sensation and strength were normal. hearing was intact to finger rubbing bilaterally. Uvula tongue midline. head turning and shoulder shrug were normal and symmetric.Tongue protrusion into cheek strength was normal. Motor: normal bulk and tone, full strength in the BUE, BLE, fine finger movements normal, no pronator drift. No focal weakness Sensory: normal and symmetric to light touch, pinprick, and  Vibration, proprioception   Coordination: finger-nose-finger, heel-to-shin bilaterally, no dysmetria Reflexes: Brachioradialis 2/2, biceps 2/2, triceps 2/2, patellar 2/2, Achilles 2/2, plantar responses were flexor bilaterally. Gait and Station: Rising up from seated position without assistance, normal stance,  moderate stride, good arm swing, smooth turning, able to perform tiptoe, and heel walking without difficulty. Tandem gait is steady  DIAGNOSTIC DATA (LABS, IMAGING, TESTING) - I reviewed patient records, labs, notes, testing and imaging myself where available.  Lab Results  Component Value Date   WBC 5.6 02/16/2017   HGB 12.7 02/16/2017   HCT 38.7 02/16/2017   MCV 82 02/16/2017   PLT 301 02/16/2017      Component Value Date/Time   NA 140 02/16/2017 1009   K 4.0 02/16/2017 1009   CL 100 02/16/2017 1009   CO2 24 02/16/2017 1009   GLUCOSE 86 02/16/2017 1009   GLUCOSE 84 10/12/2016 1643   BUN 7 02/16/2017 1009   CREATININE  0.83 02/16/2017 1009   CREATININE 0.95 10/12/2016 1643   CALCIUM 9.4 02/16/2017 1009   PROT 7.2 02/16/2017 1009   ALBUMIN 4.5 02/16/2017 1009   AST 21 02/16/2017 1009   ALT 10 02/16/2017 1009   ALKPHOS 73 02/16/2017 1009   BILITOT 0.9 02/16/2017 1009   GFRNONAA 84 02/16/2017 1009   GFRNONAA 71 10/12/2016 1643   GFRAA 96 02/16/2017 1009   GFRAA 82 10/12/2016 1643   Lab Results  Component Value Date   CHOL 155 02/16/2017   HDL 54 02/16/2017   LDLCALC 86 02/16/2017   TRIG 75 02/16/2017   CHOLHDL 2.9 02/16/2017    ASSESSMENT AND PLAN This is a very nice 51 year old patient with a history of seizures, migraines who is here for follow-up of both. She is on topiramate 100 mg twice a day there is medication noncompliance leading to breakthrough seizures and breakthrough migraines. She reports when she takes the medication she is well-controlled. Change to once a day extended release topiramate and see if this will help with compliance which it did not.   MRI brain w.wo contrast  for episodes of syncope and loss of awareness, hx of seizures If negative, recommend daily aspirin for stroke prevention Start Depakote for migraines and seizures For one week take one pill at bedtime then increase to two pills at bedtime. Needs followup in 8 weeks for depakote levels and cbc/cmp.  She is still smoking, discussed stroke risks  Discussed compliance with medication.  Patient is unable to drive, operate heavy machinery, perform activities at heights or participate in water activities until 6 months seizure free  Discussed Patients with epilepsy have a small risk of sudden unexpected death, a condition referred to as sudden unexpected death in epilepsy (SUDEP). SUDEP is defined specifically as the sudden, unexpected, witnessed or unwitnessed, nontraumatic and nondrowning death in patients with epilepsy with or without evidence for a seizure, and excluding documented status epilepticus, in which post mortem examination does not reveal a structural or toxicologic cause for death  51 y.o. year old female  has a past medical history of COPD (chronic obstructive pulmonary disease) (HCC); Headache; and Seizures (HCC). here with ***    Cline Crock, [facility], APRN  [facility] [address], [address] [place], [place] [address] [phone]

## 2017-04-26 ENCOUNTER — Ambulatory Visit: Payer: Medicaid Other | Admitting: Nurse Practitioner

## 2017-04-27 ENCOUNTER — Encounter: Payer: Self-pay | Admitting: Nurse Practitioner

## 2017-05-19 ENCOUNTER — Ambulatory Visit: Payer: Medicaid Other | Admitting: Pulmonary Disease

## 2017-06-15 NOTE — Telephone Encounter (Signed)
CMA call to f/up with patient regarding recommending a PCP in [place] who accept medicaid   Patient stated that she already took care of it & she thank us for keeping in touch with her

## 2017-06-15 NOTE — Telephone Encounter (Signed)
Please notify patient that I don't know of any PCP that I could recommend that [place] in the [place]. However they may have community based clinics within the area that will see Medicaid patients. Thank you for allowing me to care for you during your time here in [place].

## 2017-12-27 IMAGING — US US ABDOMEN COMPLETE
1 series · 14 of 25 positions shown · non-contrast
Comparison: No recent .

CLINICAL DATA: Abdominal pain .

EXAM:
ABDOMEN ULTRASOUND COMPLETE

[Series 1: us abdomen complete · 0.20mm/px · 14 of 104 slices shown]
[im 1/104]
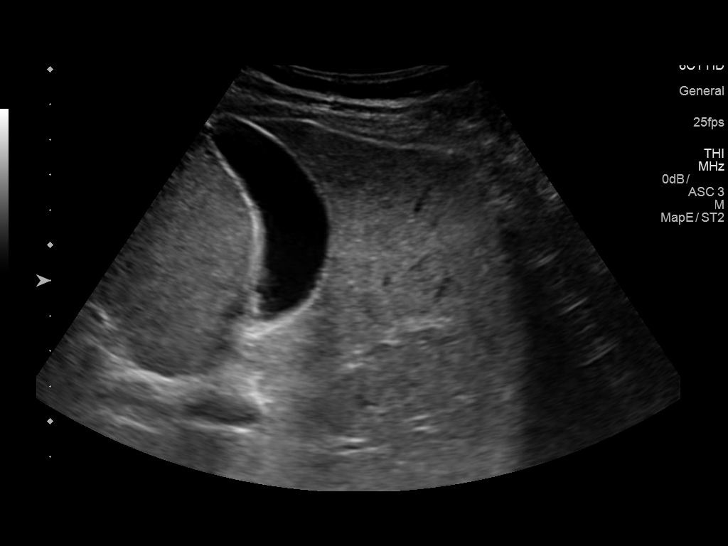
[im 9/104]
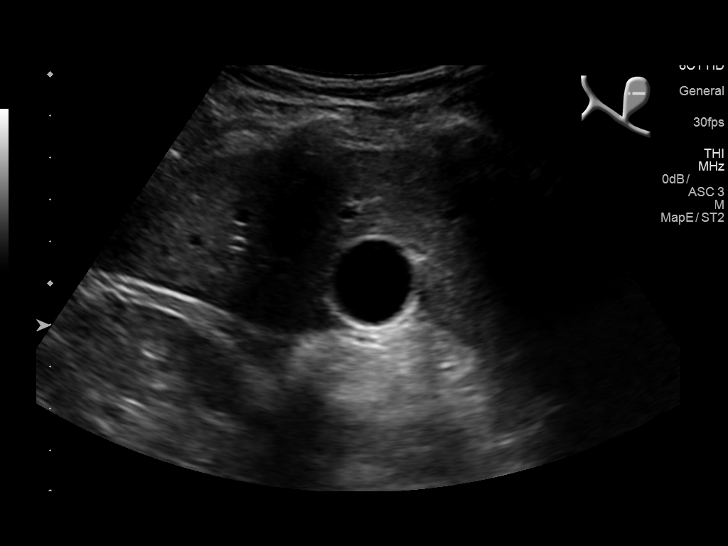
[im 18/104]
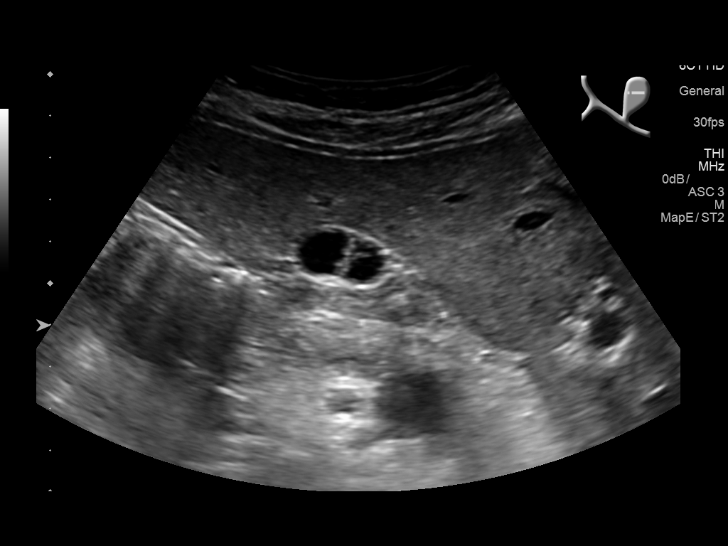
[im 26/104]
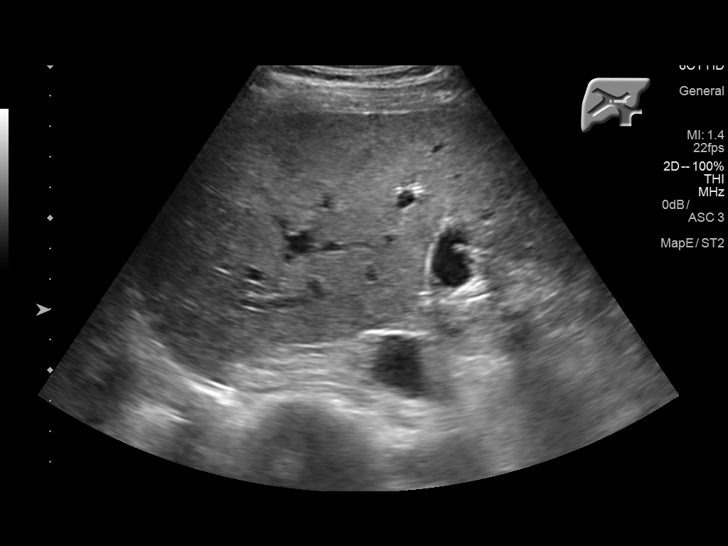
[im 35/104]
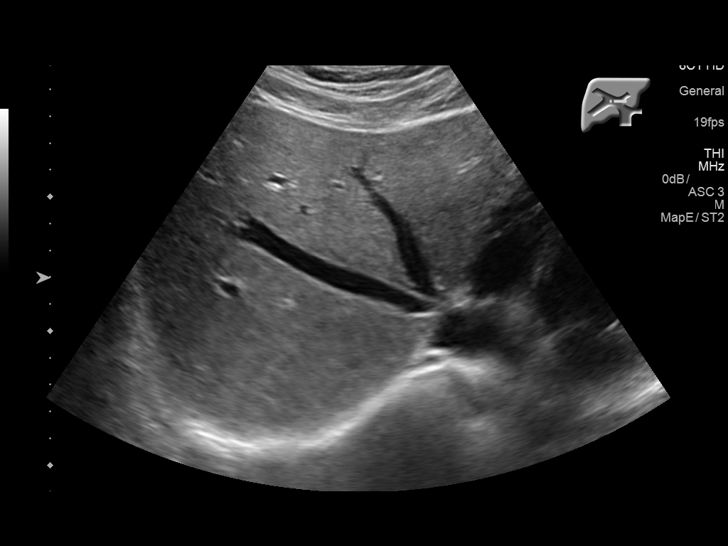
[im 39/104]
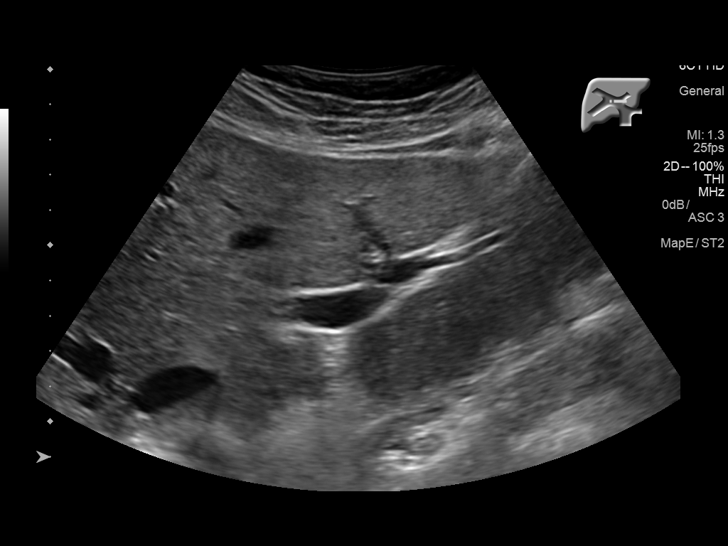
[im 48/104]
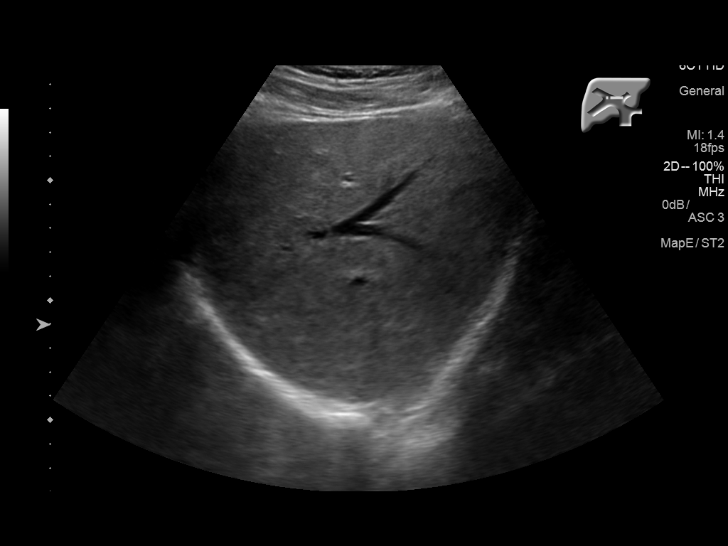
[im 56/104]
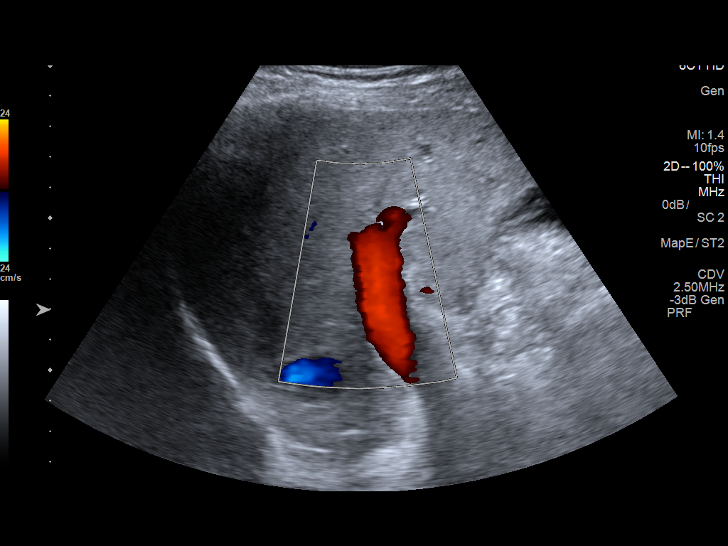
[im 65/104]
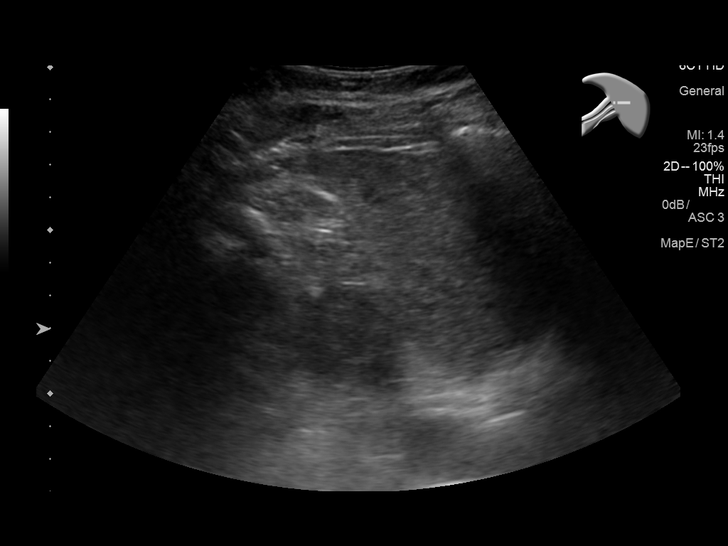
[im 69/104]
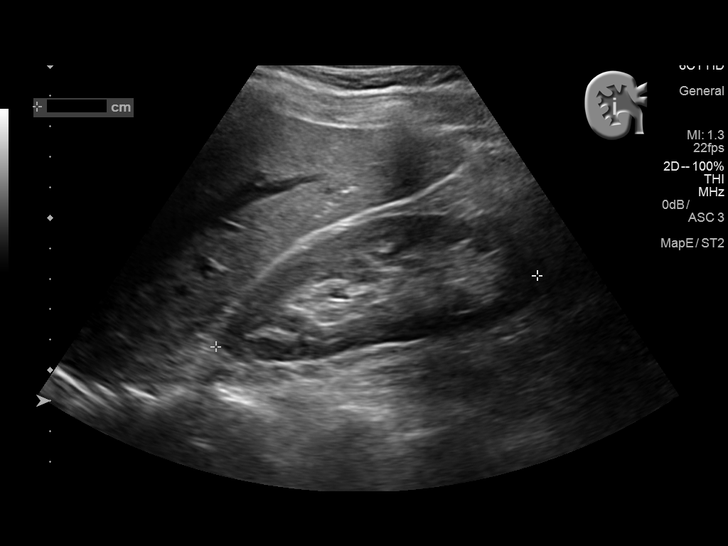
[im 78/104]
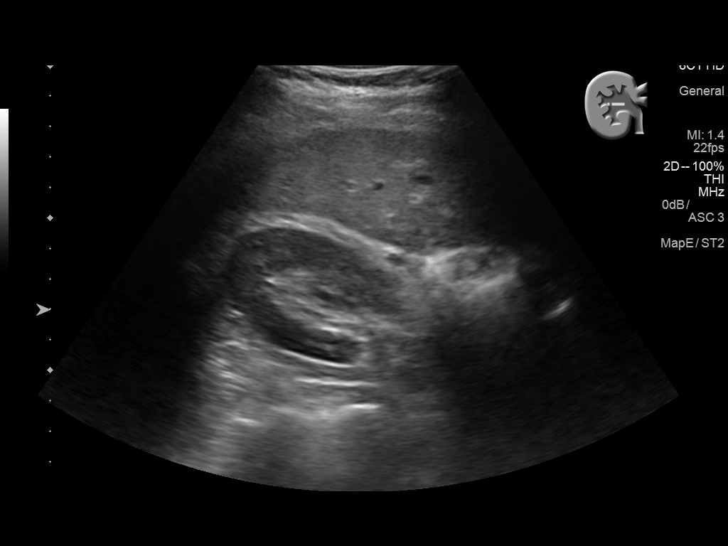
[im 86/104]
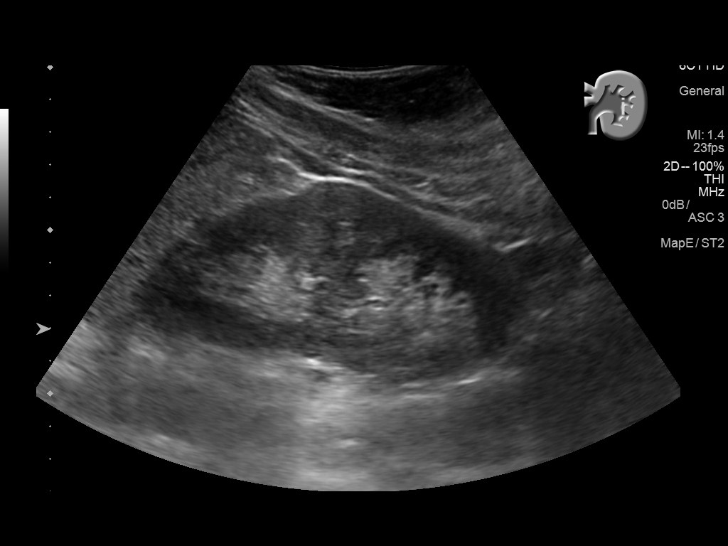
[im 95/104]
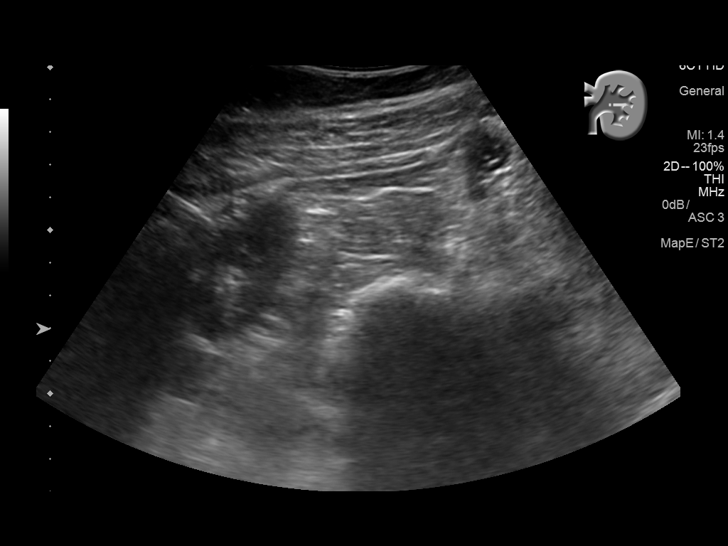
[im 104/104]
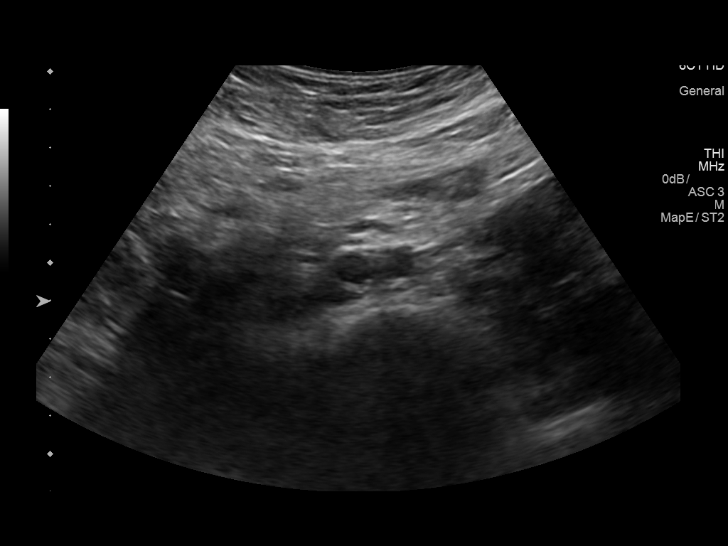

[14 of 25 positions shown; findings below may reference images not displayed]

FINDINGS: Gallbladder: No gallstones or wall thickening visualized. No
sonographic Murphy sign noted by sonographer.

Common bile duct: Diameter: 1.8 mm

Liver: No focal lesion identified. Within normal limits in
parenchymal echogenicity.

IVC: No abnormality visualized.

Pancreas: Visualized portion unremarkable.

Spleen: Size and appearance within normal limits.

Right Kidney: Length: 12.3 cm. Echogenicity within normal limits. No
mass or hydronephrosis visualized.

Left Kidney: Length: 12.0 cm. Echogenicity within normal limits. No
mass or hydronephrosis visualized.

Abdominal aorta: No aneurysm visualized.

Other findings: None.
IMPRESSION: No acute or focal abnormality.

## 2018-05-14 IMAGING — DX DG CHEST 2V
2 series · 2 of 2 positions shown · non-contrast
Comparison: None.

CLINICAL DATA: Tobacco use with history of bronchitis

EXAM:
CHEST  2 VIEW

[chest pa]
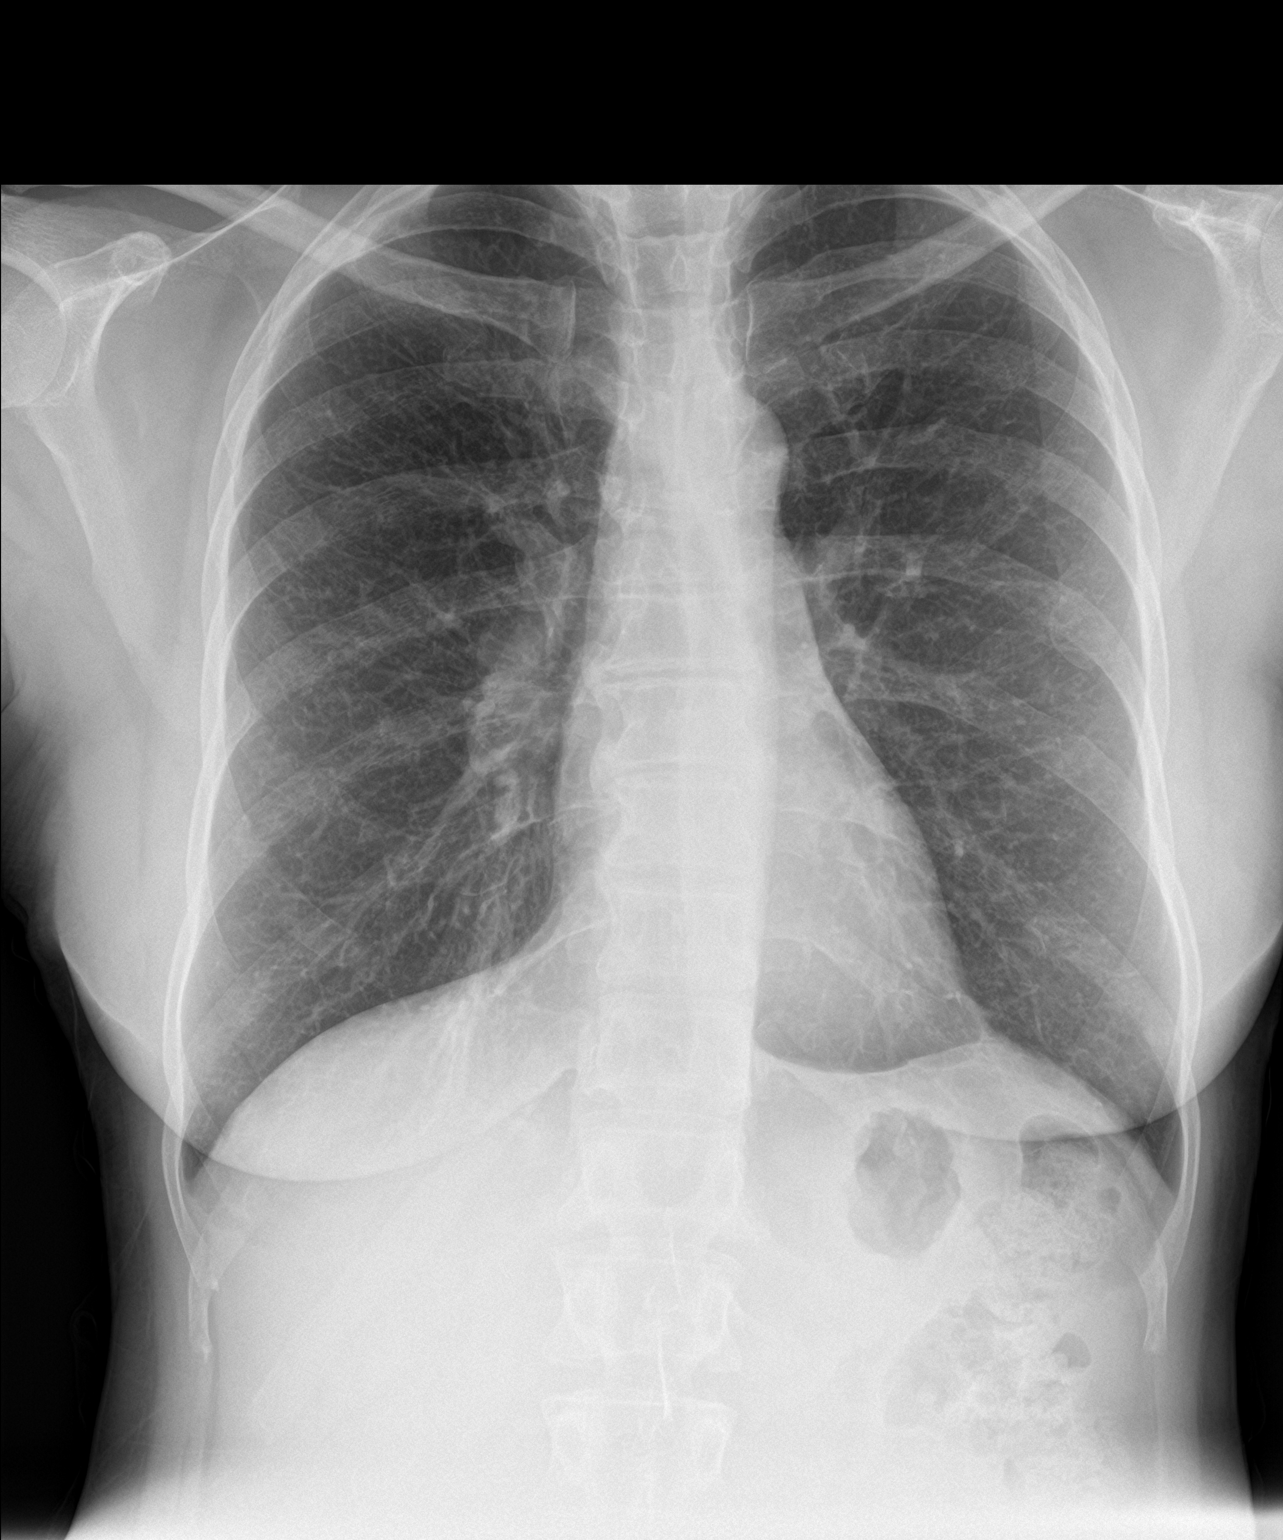

[chest lat]
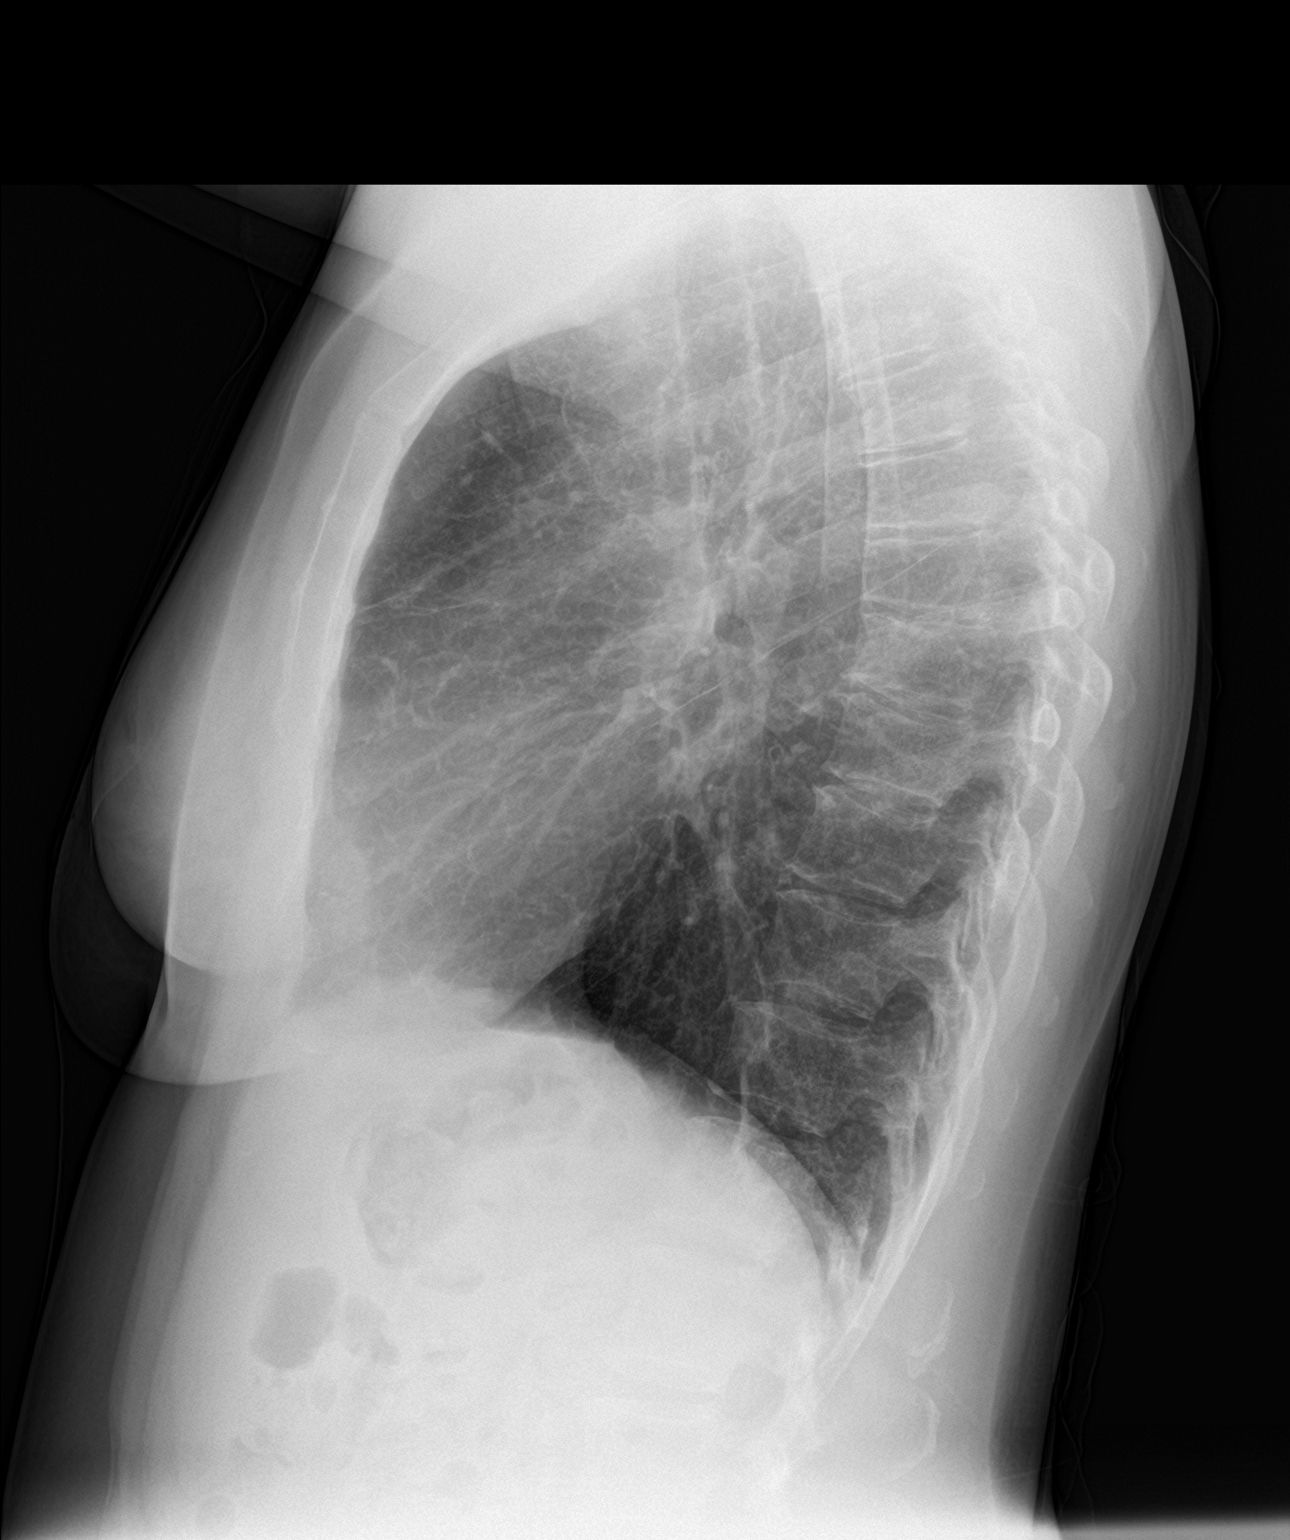

[2 of 2 positions shown; findings below may reference images not displayed]

FINDINGS: There is no appreciable edema or consolidation. There is no
appreciable bronchial thickening. Heart size and pulmonary
vascularity normal. No adenopathy. There is mild degenerative change
in the thoracic spine.
IMPRESSION: No edema or consolidation.
